# Patient Record
Sex: Female | Born: 1960 | Race: White | Hispanic: No | Smoking: Current every day smoker
Health system: Southern US, Community
[De-identification: ages and names within clinical notes are randomized; demographics above are authoritative.]

## PROBLEM LIST (undated history)

## (undated) ENCOUNTER — Emergency Department (HOSPITAL_BASED_OUTPATIENT_CLINIC_OR_DEPARTMENT_OTHER): Payer: BC Managed Care – PPO | Source: Home / Self Care

## (undated) DIAGNOSIS — R112 Nausea with vomiting, unspecified: Secondary | ICD-10-CM

## (undated) DIAGNOSIS — E669 Obesity, unspecified: Secondary | ICD-10-CM

## (undated) DIAGNOSIS — E7800 Pure hypercholesterolemia, unspecified: Secondary | ICD-10-CM

## (undated) DIAGNOSIS — Z9889 Other specified postprocedural states: Secondary | ICD-10-CM

## (undated) DIAGNOSIS — Z98811 Dental restoration status: Secondary | ICD-10-CM

## (undated) DIAGNOSIS — M1990 Unspecified osteoarthritis, unspecified site: Secondary | ICD-10-CM

## (undated) DIAGNOSIS — L608 Other nail disorders: Secondary | ICD-10-CM

## (undated) DIAGNOSIS — J302 Other seasonal allergic rhinitis: Secondary | ICD-10-CM

## (undated) HISTORY — PX: MENISCUS REPAIR: SHX5179

## (undated) HISTORY — DX: Unspecified osteoarthritis, unspecified site: M19.90

## (undated) HISTORY — DX: Obesity, unspecified: E66.9

## (undated) HISTORY — DX: Other seasonal allergic rhinitis: J30.2

---

## 2001-08-27 HISTORY — PX: ANTERIOR CRUCIATE LIGAMENT REPAIR: SHX115

## 2004-11-25 HISTORY — PX: CHOLECYSTECTOMY: SHX55

## 2010-03-09 ENCOUNTER — Other Ambulatory Visit: Admission: RE | Admit: 2010-03-09 | Discharge: 2010-03-09 | Payer: Self-pay | Admitting: Family Medicine

## 2010-08-09 ENCOUNTER — Emergency Department (HOSPITAL_COMMUNITY): Admission: EM | Admit: 2010-08-09 | Discharge: 2010-08-10 | Payer: Self-pay | Admitting: Emergency Medicine

## 2010-12-08 LAB — URINALYSIS, ROUTINE W REFLEX MICROSCOPIC
Bilirubin Urine: NEGATIVE
Glucose, UA: NEGATIVE mg/dL
Protein, ur: NEGATIVE mg/dL
Urobilinogen, UA: 1 mg/dL (ref 0.0–1.0)

## 2010-12-08 LAB — URINE CULTURE: Colony Count: >=100000

## 2010-12-08 LAB — CBC
Hemoglobin: 14.5 g/dL (ref 12.0–15.0)
MCH: 30 pg (ref 26.0–34.0)
MCHC: 34.4 g/dL (ref 30.0–36.0)
MCV: 87.2 fL (ref 78.0–100.0)
RBC: 4.83 MIL/uL (ref 3.87–5.11)

## 2010-12-08 LAB — DIFFERENTIAL
Basophils Relative: 2 % — ABNORMAL HIGH (ref 0–1)
Eosinophils Absolute: 0.2 10*3/uL (ref 0.0–0.7)
Eosinophils Relative: 1 % (ref 0–5)
Lymphs Abs: 2.5 10*3/uL (ref 0.7–4.0)
Monocytes Absolute: 1.3 10*3/uL — ABNORMAL HIGH (ref 0.1–1.0)
Monocytes Relative: 12 % (ref 3–12)
Neutrophils Relative %: 63 % (ref 43–77)

## 2010-12-08 LAB — BASIC METABOLIC PANEL
CO2: 27 mEq/L (ref 19–32)
Calcium: 9.6 mg/dL (ref 8.4–10.5)
Chloride: 106 mEq/L (ref 96–112)
Glucose, Bld: 117 mg/dL — ABNORMAL HIGH (ref 70–99)
Sodium: 142 mEq/L (ref 135–145)

## 2010-12-08 LAB — URINE MICROSCOPIC-ADD ON

## 2011-02-19 ENCOUNTER — Other Ambulatory Visit: Payer: Self-pay | Admitting: General Surgery

## 2011-02-19 DIAGNOSIS — K469 Unspecified abdominal hernia without obstruction or gangrene: Secondary | ICD-10-CM

## 2011-02-26 ENCOUNTER — Ambulatory Visit
Admission: RE | Admit: 2011-02-26 | Discharge: 2011-02-26 | Disposition: A | Discharge status: Home / Self Care | Payer: BC Managed Care – PPO | Source: Ambulatory Visit | Attending: General Surgery | Admitting: General Surgery

## 2011-02-26 DIAGNOSIS — K469 Unspecified abdominal hernia without obstruction or gangrene: Secondary | ICD-10-CM

## 2011-02-26 MED ORDER — IOHEXOL 300 MG/ML  SOLN
125.0000 mL | Freq: Once | INTRAMUSCULAR | Status: AC | PRN
  Administered 2011-02-26: 125 mL via INTRAVENOUS

## 2011-03-30 ENCOUNTER — Telehealth (INDEPENDENT_AMBULATORY_CARE_PROVIDER_SITE_OTHER): Payer: Self-pay | Admitting: General Surgery

## 2011-03-30 NOTE — Telephone Encounter (Signed)
Patient called to discuss post op instructions. She is scheduled for surgery for a hernia repair. Pt wanted to know if there were any dietary restrictions post op. I informed patient there would be no restrictions on her diet. She also wanted to know if she could swim. I let her know that once she heals from surgery she could resume swimming, but it would be a couple weeks after surgery. She could also do no lifting over 15 lbs for 6 weeks post hernia repair. Pt was satisfied with this information and will call back with any questions.

## 2011-04-06 ENCOUNTER — Ambulatory Visit (HOSPITAL_COMMUNITY)
Admission: RE | Admit: 2011-04-06 | Discharge: 2011-04-06 | Disposition: A | Discharge status: Home / Self Care | Payer: BC Managed Care – PPO | Source: Ambulatory Visit | Attending: General Surgery | Admitting: General Surgery

## 2011-04-06 ENCOUNTER — Other Ambulatory Visit: Payer: Self-pay | Admitting: General Surgery

## 2011-04-06 ENCOUNTER — Telehealth (INDEPENDENT_AMBULATORY_CARE_PROVIDER_SITE_OTHER): Payer: Self-pay | Admitting: General Surgery

## 2011-04-06 ENCOUNTER — Encounter (HOSPITAL_COMMUNITY)
Admission: RE | Admit: 2011-04-06 | Discharge: 2011-04-06 | Disposition: A | Discharge status: Home / Self Care | Payer: BC Managed Care – PPO | Source: Ambulatory Visit | Attending: General Surgery | Admitting: General Surgery

## 2011-04-06 DIAGNOSIS — Z01812 Encounter for preprocedural laboratory examination: Secondary | ICD-10-CM | POA: Insufficient documentation

## 2011-04-06 DIAGNOSIS — Z01811 Encounter for preprocedural respiratory examination: Secondary | ICD-10-CM

## 2011-04-06 DIAGNOSIS — Z01818 Encounter for other preprocedural examination: Secondary | ICD-10-CM | POA: Insufficient documentation

## 2011-04-06 DIAGNOSIS — K439 Ventral hernia without obstruction or gangrene: Secondary | ICD-10-CM

## 2011-04-06 DIAGNOSIS — Z01810 Encounter for preprocedural cardiovascular examination: Secondary | ICD-10-CM | POA: Insufficient documentation

## 2011-04-06 LAB — SURGICAL PCR SCREEN
MRSA, PCR: NEGATIVE
Staphylococcus aureus: POSITIVE — AB

## 2011-04-06 LAB — CBC
MCH: 29.9 pg (ref 26.0–34.0)
MCHC: 34 g/dL (ref 30.0–36.0)
MCV: 87.8 fL (ref 78.0–100.0)
Platelets: 245 10*3/uL (ref 150–400)
RBC: 5.09 MIL/uL (ref 3.87–5.11)
RDW: 13.5 % (ref 11.5–15.5)

## 2011-04-06 LAB — DIFFERENTIAL
Basophils Relative: 1 % (ref 0–1)
Eosinophils Absolute: 0.1 10*3/uL (ref 0.0–0.7)
Eosinophils Relative: 2 % (ref 0–5)
Lymphs Abs: 2.9 10*3/uL (ref 0.7–4.0)
Monocytes Absolute: 0.8 10*3/uL (ref 0.1–1.0)
Monocytes Relative: 8 % (ref 3–12)
Neutrophils Relative %: 59 % (ref 43–77)

## 2011-04-06 LAB — BASIC METABOLIC PANEL
CO2: 30 mEq/L (ref 19–32)
Calcium: 9.8 mg/dL (ref 8.4–10.5)
Creatinine, Ser: 0.67 mg/dL (ref 0.50–1.10)
GFR calc non Af Amer: >60 mL/min (ref >60)
Sodium: 142 mEq/L (ref 135–145)

## 2011-04-06 NOTE — Telephone Encounter (Signed)
Per Dr Andrey Campanile, labs ok for surgery. Labs printed and faxed with cover sheet making pre-admit aware that labs are ok. Faxed to [phone].

## 2011-04-06 NOTE — Telephone Encounter (Signed)
Message copied by Liliana Cline on Tue Apr 06, 2011  3:29 PM ------      Message from: Andrey Campanile, ERIC M      Created: Tue Apr 06, 2011 11:19 AM       Labs ok for surgery

## 2011-04-08 ENCOUNTER — Ambulatory Visit (HOSPITAL_COMMUNITY)
Admission: RE | Admit: 2011-04-08 | Discharge: 2011-04-09 | Disposition: A | Discharge status: Home / Self Care | Payer: BC Managed Care – PPO | Source: Ambulatory Visit | Attending: General Surgery | Admitting: General Surgery

## 2011-04-08 DIAGNOSIS — F17200 Nicotine dependence, unspecified, uncomplicated: Secondary | ICD-10-CM | POA: Insufficient documentation

## 2011-04-08 DIAGNOSIS — E669 Obesity, unspecified: Secondary | ICD-10-CM | POA: Insufficient documentation

## 2011-04-08 DIAGNOSIS — K439 Ventral hernia without obstruction or gangrene: Secondary | ICD-10-CM

## 2011-04-08 DIAGNOSIS — K429 Umbilical hernia without obstruction or gangrene: Secondary | ICD-10-CM | POA: Insufficient documentation

## 2011-04-08 DIAGNOSIS — M129 Arthropathy, unspecified: Secondary | ICD-10-CM | POA: Insufficient documentation

## 2011-04-08 DIAGNOSIS — F411 Generalized anxiety disorder: Secondary | ICD-10-CM | POA: Insufficient documentation

## 2011-04-08 HISTORY — PX: LAPAROSCOPIC INCISIONAL / UMBILICAL / VENTRAL HERNIA REPAIR: SUR789

## 2011-04-08 LAB — POCT I-STAT 4, (NA,K, GLUC, HGB,HCT)
Glucose, Bld: 101 mg/dL — ABNORMAL HIGH (ref 70–99)
HCT: 46 % (ref 36.0–46.0)

## 2011-04-13 ENCOUNTER — Encounter (INDEPENDENT_AMBULATORY_CARE_PROVIDER_SITE_OTHER): Payer: Self-pay | Admitting: General Surgery

## 2011-04-14 NOTE — Op Note (Signed)
Jamie Paul, Jamie Paul                ACCOUNT NO.:  [id number]  MEDICAL RECORD NO.:  [id number]  LOCATION:  5124                         FACILITY:  [facility]  PHYSICIAN:  Mary Sella. Andrey Campanile, MD     DATE OF BIRTH:  12/03/1960  DATE OF PROCEDURE:  04/08/2011 DATE OF DISCHARGE:                              OPERATIVE REPORT   PREOPERATIVE DIAGNOSIS:  Ventral umbilical hernia.  POSTOPERATIVE DIAGNOSIS:  Ventral umbilical hernia.  PROCEDURE:  Laparoscopic ventral umbilical hernia repair with mesh.  SURGEON:  Mary Sella.  Andrey Campanile, MD  ANESTHESIA:  General plus local consisting 0.25% Marcaine with epi.  FINDINGS:  The patient had a 5.5 x 4.5 cm fascial defect around her umbilicus.  When I measured, adding 5 cm in a circumferential manner, they gave me a mesh size of around 13 cm x 12cm. The closest sized mesh size mesh was  a 15 x 20 cm piece of Ventralite ST Bard mesh.  ESTIMATED BLOOD LOSS:  Minimal.  INDICATIONS FOR PROCEDURE:  Patient is a 50 year old morbidly obese Caucasian female who I saw last year initially for her hernia.  At that time, she was obese and smoking and essentially asymptomatic.  I counseled her on weight loss and smoking cessation as well in order to decrease her chance of recurrence or perioperative complications.  She represented to office for new onset of abdominal pain around her umbilicus.  She states that she had been exercising and actually lost 30 pounds, but now she is at the point where the hernia was causing her discomfort when she was doing exercises at the gym.  She still continues to smoke.  We discussed the risks and benefits of surgery including, but not limited to bleeding, infection, injury to surrounding structures, a blood clot formation, wound complications, mesh complications, need to convert to an open procedure and hernia recurrence.  We also talked about the possibility of a prolonged abdominal wall pain due to the hernia repair.  Interestingly,  although she said she had lost 30 pounds she weighed about the same that she did last year, but since she was symptomatic I felt the prudent thing today was to go to proceed to the operating room.  DESCRIPTION OF PROCEDURE:  After obtaining informed consent, the patient was brought to the operating room, placed supine on the operating room table.  General endotracheal anesthesia was established.  A Foley catheter was placed.  Sequential compression devices were placed.  Her abdomen was prepped and draped in the usual standard surgical fashion. She received antibiotics prior to skin incision.  A surgical time-out was performed.  I elected to gain entry to her abdomen using OptiView technique, a 1-cm incision was made in the left upper quadrant, 2 fingerbreadths below her left subcostal margin with #11 blade and using over the 5-mm trocar with a 0-degree 5 mm scope.  I advanced the trocar through all layers of the abdominal wall and smoothly entered her abdominal cavity. Pneumoperitoneum was smoothly established up to a patient pressure of 15 mmHg.  The laparoscope was advanced and the abdominal cavity was surveilled.  She had a plug of omentum into the periumbilical hernia.  I placed  a 11-mm trocar in the left lateral abdominal wall under direct visualization after local had been infiltrated.  Then, using an atraumatic grasper, I reduced the omental plug from the fascial defect. Then, using a spinal needle I marked boundaries of the fascial defect which measured to be about 5.5 x 4 cm.  Then, using a marking pen on the skin I added 5 cm around those marks which gave me a size of mesh to use of around 12 cm x 13 cm.  I placed a size mesh was 15 x 20 cm.  I elected not to trim the mesh.  I placed eight #1 Novafil sutures through the mesh in a circumferential manner around the borders of the mesh, then rolled the mesh like a cigar and placed it through the trocar and rolled it in the  abdominal cavity.  I then made 2-mm skin incisions in a circumferential pattern around the boundaries of the hernia defect out laterally in a circumferential pattern.  Then, using the [employer], I brought up the tails of the sutures that I had been made into the mesh to create a transfascial suture.  This was done 8 times to give a total number of 8 transfascial sutures.  There is a little bit of redundancy in the mesh initially.  Therefore, I released the inferior aspects of the transfascial sutures from the inferior edge of the mesh and then brought in the Endopasser a little bit more inferior through the abdominal wall.  The mesh was then brought up to the abdominal wall. There was a very little redundancy in the mesh at this point.  I tied down all the transfascial sutures and trimmed the excess suture thus all the sutures were underneath the skin.  At this point, I used an Ethicon secure strap tacker to tack the mesh to the abdominal wall in a circumferential manner around the edges of the mesh and then I placed some additional tacks around the actual fascial defect.  The mesh was well seated and there were no signs of bleeding.  Pneumoperitoneum was released and the trocars were removed.  The two trocar sites were closed with 4-0 Monocryl in subcuticular fashion and the remaining skin incisions in the trocar sites were closed with Dermabond.  The Foley catheter was removed.  All needle, instrument counts were correct x2. There were no immediate complications.  The patient tolerated the procedure well.  The patient was transferred to the recovery room.     Mary Sella. Andrey Campanile, MD     EMW/MEDQ  D:  04/08/2011  T:  04/08/2011  Job:  [id number]  Electronically Signed by Gaynelle Adu M.D. on 04/14/2011 11:54:55 PM

## 2011-04-16 ENCOUNTER — Ambulatory Visit (INDEPENDENT_AMBULATORY_CARE_PROVIDER_SITE_OTHER): Payer: BC Managed Care – PPO | Admitting: General Surgery

## 2011-04-16 ENCOUNTER — Encounter (INDEPENDENT_AMBULATORY_CARE_PROVIDER_SITE_OTHER): Payer: Self-pay | Admitting: General Surgery

## 2011-04-16 VITALS — BP 118/78 | Temp 96.6°F

## 2011-04-16 DIAGNOSIS — Z9889 Other specified postprocedural states: Secondary | ICD-10-CM

## 2011-04-16 DIAGNOSIS — Z8719 Personal history of other diseases of the digestive system: Secondary | ICD-10-CM

## 2011-04-16 MED ORDER — NYSTATIN-TRIAMCINOLONE 100000-0.1 UNIT/GM-% EX OINT: TOPICAL_OINTMENT | Freq: Two times a day (BID) | CUTANEOUS | Status: DC

## 2011-04-16 MED ORDER — POLYETHYLENE GLYCOL 3350 17 GM/SCOOP PO POWD: 17.0000 g | ORAL | Status: AC | PRN

## 2011-04-16 NOTE — Progress Notes (Signed)
Procedure: Laparoscopic ventral hernia repair with mesh April 08, 2011  History of Present Ilness: 50 year old obese Caucasian female comes in for one week postoperative check. Since surgery she's been doing fairly well. She states that the abdominal pain has gotten progressively less intense. She denies any nausea, vomiting, fever or chills. She states the main thing has just been a low energy level. She was having daily bowel movements until this past Wednesday. She has not had a bowel movement since Wednesday. She denies any problems with her incisions. She states that she is now able to sleep in the bed. She also has developed a rash in her suprapubic area. It is itchy.  Physical Exam: BP 118/78  Temp(Src) 96.6 F (35.9 C) (Temporal)  Well-developed well-nourished morbidly obese Caucasian female in no apparent distress Pulmonary-lungs are clear Cardiac-regular rate and rhythm Abdomen- obese, soft, nontender, nondistended. Her trocar incisions are well-healed. She has a little bit of puckering in her upper abdomen at one of the transfacial suture sites. Underneath her pannus, she has a fungal rash.   Assessment and Plan: Status post laparoscopic ventral hernia repair with mesh on April 08, 2011. I think overall she's doing very well for one week out. I gave her prescription for MiraLax. I told her to call the office if she should not have a bowel movement last Sunday. I also gave her a prescription for nystatin ointment for her suprapubic rash. I advised her that she should not do any heavy lifting for 6 weeks. I will see her in 6 weeks.

## 2011-04-16 NOTE — Patient Instructions (Signed)
No heavy lifting for 6 weeks. May return to work in 1 week. Call if no bowel movement by Sunday

## 2011-05-20 ENCOUNTER — Ambulatory Visit (INDEPENDENT_AMBULATORY_CARE_PROVIDER_SITE_OTHER): Payer: BC Managed Care – PPO | Admitting: General Surgery

## 2011-05-20 ENCOUNTER — Encounter (INDEPENDENT_AMBULATORY_CARE_PROVIDER_SITE_OTHER): Payer: Self-pay | Admitting: General Surgery

## 2011-05-20 VITALS — BP 126/84 | HR 58

## 2011-05-20 DIAGNOSIS — Z9889 Other specified postprocedural states: Secondary | ICD-10-CM

## 2011-05-20 DIAGNOSIS — Z8719 Personal history of other diseases of the digestive system: Secondary | ICD-10-CM

## 2011-05-20 DIAGNOSIS — Z09 Encounter for follow-up examination after completed treatment for conditions other than malignant neoplasm: Secondary | ICD-10-CM

## 2011-05-20 NOTE — Patient Instructions (Signed)
Can resume full activities next week

## 2011-05-20 NOTE — Progress Notes (Signed)
Chief complaint: Postop  Procedure: Status post laparoscopic ventral hernia repair with mesh on April 08, 2011  History of Present Ilness: 50 year old obese Caucasian female comes in today for her second postoperative appointment. I last saw her on July 20. Since she was last seen she states that she is doing great. She denies any fever, chills, nausea, or vomiting. Her constipation that she had at her last appointment has resolved. She reports daily bowel movements. She reports a normal appetite. She really does not have any ongoing abdominal wall discomfort. She still has the rash in her right inguinal area. She did use the nystatin ointment and it did get better. However there are 2 persistent areas that itch. She has not changed any soaps or laundry detergent  Physical Exam: BP 126/84  Pulse 58  Well-developed well-nourished obese Caucasian female in no apparent distress Pulmonary-lungs are clear Abdomen-soft, nontender, nondistended. Well-healed trocar incisions. No signs of ventral hernia recurrence. No cellulitis. Skin-in her right lower abdominal wall under her pannus there are 2 separate areas of skin changes. Each of these areas are about 3" x 2". The skin in this area is red and almost appears excoriated. There is no fluctuance or induration.  Assessment and Plan: Status post laparoscopic ventral hernia repair with mesh-doing well  I have released her to full activity starting next week. She is already returned to work.  With respect to the skin lesions, I am unsure of the etiology. It does not appear to be fungal. I have advised her to followup with her primary care physician.  I did recommend returning to the gym to resume her exercise as well as to watch her diet to help with weight loss.  I will see her in 6 months

## 2011-06-16 ENCOUNTER — Other Ambulatory Visit (HOSPITAL_COMMUNITY): Payer: Self-pay | Admitting: Family Medicine

## 2011-06-16 DIAGNOSIS — Z1231 Encounter for screening mammogram for malignant neoplasm of breast: Secondary | ICD-10-CM

## 2011-06-24 ENCOUNTER — Ambulatory Visit (HOSPITAL_COMMUNITY)
Admission: RE | Admit: 2011-06-24 | Discharge: 2011-06-24 | Disposition: A | Discharge status: Home / Self Care | Payer: BC Managed Care – PPO | Source: Ambulatory Visit | Attending: Family Medicine | Admitting: Family Medicine

## 2011-06-24 ENCOUNTER — Other Ambulatory Visit: Payer: Self-pay | Admitting: Family Medicine

## 2011-06-24 DIAGNOSIS — Z1231 Encounter for screening mammogram for malignant neoplasm of breast: Secondary | ICD-10-CM

## 2011-06-24 DIAGNOSIS — N63 Unspecified lump in unspecified breast: Secondary | ICD-10-CM

## 2011-07-06 ENCOUNTER — Ambulatory Visit
Admission: RE | Admit: 2011-07-06 | Discharge: 2011-07-06 | Disposition: A | Discharge status: Home / Self Care | Payer: BC Managed Care – PPO | Source: Ambulatory Visit | Attending: Family Medicine | Admitting: Family Medicine

## 2011-07-06 DIAGNOSIS — N63 Unspecified lump in unspecified breast: Secondary | ICD-10-CM

## 2011-10-21 ENCOUNTER — Encounter (INDEPENDENT_AMBULATORY_CARE_PROVIDER_SITE_OTHER): Payer: Self-pay | Admitting: General Surgery

## 2012-07-17 ENCOUNTER — Other Ambulatory Visit: Payer: Self-pay | Admitting: Family Medicine

## 2012-07-17 DIAGNOSIS — Z1231 Encounter for screening mammogram for malignant neoplasm of breast: Secondary | ICD-10-CM

## 2012-07-28 HISTORY — PX: ANKLE SURGERY: SHX546

## 2012-08-23 ENCOUNTER — Ambulatory Visit: Payer: BC Managed Care – PPO

## 2013-02-04 ENCOUNTER — Encounter (HOSPITAL_BASED_OUTPATIENT_CLINIC_OR_DEPARTMENT_OTHER): Payer: Self-pay

## 2013-02-04 ENCOUNTER — Emergency Department (HOSPITAL_BASED_OUTPATIENT_CLINIC_OR_DEPARTMENT_OTHER)
Admission: EM | Admit: 2013-02-04 | Discharge: 2013-02-04 | Disposition: A | Discharge status: Home / Self Care | Payer: BC Managed Care – PPO | Attending: Emergency Medicine | Admitting: Emergency Medicine

## 2013-02-04 DIAGNOSIS — S61209A Unspecified open wound of unspecified finger without damage to nail, initial encounter: Secondary | ICD-10-CM | POA: Insufficient documentation

## 2013-02-04 DIAGNOSIS — Y9389 Activity, other specified: Secondary | ICD-10-CM | POA: Insufficient documentation

## 2013-02-04 DIAGNOSIS — F17200 Nicotine dependence, unspecified, uncomplicated: Secondary | ICD-10-CM | POA: Insufficient documentation

## 2013-02-04 DIAGNOSIS — W260XXA Contact with knife, initial encounter: Secondary | ICD-10-CM | POA: Insufficient documentation

## 2013-02-04 DIAGNOSIS — Z8719 Personal history of other diseases of the digestive system: Secondary | ICD-10-CM | POA: Insufficient documentation

## 2013-02-04 DIAGNOSIS — Z79899 Other long term (current) drug therapy: Secondary | ICD-10-CM | POA: Insufficient documentation

## 2013-02-04 DIAGNOSIS — E785 Hyperlipidemia, unspecified: Secondary | ICD-10-CM | POA: Insufficient documentation

## 2013-02-04 DIAGNOSIS — Z8739 Personal history of other diseases of the musculoskeletal system and connective tissue: Secondary | ICD-10-CM | POA: Insufficient documentation

## 2013-02-04 DIAGNOSIS — S61012A Laceration without foreign body of left thumb without damage to nail, initial encounter: Secondary | ICD-10-CM

## 2013-02-04 DIAGNOSIS — Y929 Unspecified place or not applicable: Secondary | ICD-10-CM | POA: Insufficient documentation

## 2013-02-04 DIAGNOSIS — E669 Obesity, unspecified: Secondary | ICD-10-CM | POA: Insufficient documentation

## 2013-02-04 NOTE — ED Provider Notes (Signed)
History     CSN: [id number]  Arrival date & time 02/04/13  1010   First MD Initiated Contact with Patient 02/04/13 1237      Chief Complaint  Patient presents with  . Laceration    (Consider location/radiation/quality/duration/timing/severity/associated sxs/prior treatment) Patient is a 52 y.o. female presenting with skin laceration. The history is provided by the patient. No language interpreter was used.  Laceration Location:  Finger Finger laceration location:  L thumb Length (cm):  1 Depth:  Cutaneous Quality: straight   Bleeding: controlled   Time since incident:  2 hours Laceration mechanism:  Knife Pain details:    Severity:  Mild Foreign body present:  No foreign bodies Relieved by:  Nothing Pt cut finger ona knife while cutting a bagel  Past Medical History  Diagnosis Date  . Arthritis   . Hyperlipemia   . Obesity   . Seasonal allergies   . Ventral hernia     s/p laparoscopic repair 04/08/2011    Past Surgical History  Procedure Laterality Date  . Gallbladder surgery  11/2004  . Anterior cruciate ligament repair  08/2001    left  . Meniscus repair  5/99; 6/00    left  . Cholecystectomy  11/2004  . Laparoscopic incisional / umbilical / ventral hernia repair  04/08/2011    ventral umbilical hernia; Dr Gaynelle Adu    Family History  Problem Relation Age of Onset  . Heart attack Father   . Arthritis Mother   . Diabetes Mother   . Hyperlipidemia Brother   . Diabetes Brother   . Diabetes Sister     History  Substance Use Topics  . Smoking status: Current Every Day Smoker -- 0.50 packs/day  . Smokeless tobacco: Not on file  . Alcohol Use: Yes     Comment: "rarely"    OB History   Grav Para Term Preterm Abortions TAB SAB Ect Mult Living                  Review of Systems  All other systems reviewed and are negative.    Allergies  Review of patient's allergies indicates no known allergies.  Home Medications   Current Outpatient Rx   Name  Route  Sig  Dispense  Refill  . acetaminophen (TYLENOL ARTHRITIS PAIN) 650 MG CR tablet   Oral   Take 650 mg by mouth every 8 (eight) hours as needed for pain.         Marland Kitchen atorvastatin (LIPITOR) 20 MG tablet   Oral   Take 20 mg by mouth daily.           . Loratadine (CLARITIN PO)   Oral   Take by mouth daily.           . montelukast (SINGULAIR) 10 MG tablet   Oral   Take 10 mg by mouth at bedtime.           . celecoxib (CELEBREX) 200 MG capsule   Oral   Take 200 mg by mouth 2 (two) times daily.           . ergocalciferol (VITAMIN D2) 50000 UNITS capsule   Oral   Take 50,000 Units by mouth once a week.             BP 151/77  Pulse 95  Temp(Src) 99.3 F (37.4 C) (Oral)  Resp 20  Ht 5\' 7"  (1.702 m)  Wt 298 lb (135.172 kg)  BMI 46.66 kg/m2  SpO2 94%  Physical Exam  Nursing note and vitals reviewed. Constitutional: She appears well-developed and well-nourished.  Musculoskeletal:  1cm laceration dorsal aspect of finger  Skin: Skin is warm.  Psychiatric: She has a normal mood and affect.    ED Course  LACERATION REPAIR Date/Time: 02/04/2013 2:18 PM Performed by: Elson Areas Authorized by: Elson Areas Consent: Verbal consent not obtained. Risks and benefits: risks, benefits and alternatives were discussed Required items: required blood products, implants, devices, and special equipment available Body area: upper extremity Laceration length: 1 cm Foreign bodies: no foreign bodies Irrigation solution: saline Debridement: none Skin closure: glue Approximation: loose Patient tolerance: Patient tolerated the procedure well with no immediate complications.   (including critical care time)  Labs Reviewed - No data to display No results found.   1. Laceration of thumb, left, initial encounter    Cut slightly deeper than a paper cut,  Edges lay together perfectly,  Pt offered sutures or dermabond.    MDM  Pt counseled on wound care,   Placed in a splint        Elson Areas, PA-C 02/04/13 1419

## 2013-02-04 NOTE — ED Notes (Signed)
Pt states that she was slicing bagels and sliced her L thumb with a knife.  Pt states that she is concerned because the bleeding took a long time to stop.  Bleeding controlled at present, bandaged well from home.

## 2013-02-04 NOTE — ED Provider Notes (Signed)
Medical screening examination/treatment/procedure(s) were performed by non-physician practitioner and as supervising physician I was immediately available for consultation/collaboration.  Ethelda Chick, MD 02/04/13 [phone]

## 2013-04-08 DIAGNOSIS — Z124 Encounter for screening for malignant neoplasm of cervix: Secondary | ICD-10-CM | POA: Insufficient documentation

## 2013-04-08 DIAGNOSIS — Z1151 Encounter for screening for human papillomavirus (HPV): Secondary | ICD-10-CM | POA: Insufficient documentation

## 2013-05-09 ENCOUNTER — Other Ambulatory Visit: Payer: Self-pay | Admitting: Family Medicine

## 2013-05-09 ENCOUNTER — Other Ambulatory Visit (HOSPITAL_COMMUNITY)
Admission: RE | Admit: 2013-05-09 | Discharge: 2013-05-09 | Disposition: A | Discharge status: Home / Self Care | Payer: BC Managed Care – PPO | Source: Ambulatory Visit | Attending: Family Medicine | Admitting: Family Medicine

## 2014-02-20 ENCOUNTER — Other Ambulatory Visit: Payer: Self-pay

## 2014-02-20 DIAGNOSIS — Z1231 Encounter for screening mammogram for malignant neoplasm of breast: Secondary | ICD-10-CM

## 2014-02-25 ENCOUNTER — Ambulatory Visit
Admission: RE | Admit: 2014-02-25 | Discharge: 2014-02-25 | Disposition: A | Discharge status: Home / Self Care | Payer: BC Managed Care – PPO | Source: Ambulatory Visit

## 2014-02-25 ENCOUNTER — Encounter (INDEPENDENT_AMBULATORY_CARE_PROVIDER_SITE_OTHER): Payer: Self-pay

## 2014-02-25 DIAGNOSIS — Z1231 Encounter for screening mammogram for malignant neoplasm of breast: Secondary | ICD-10-CM

## 2014-02-26 ENCOUNTER — Other Ambulatory Visit: Payer: Self-pay | Admitting: Family Medicine

## 2014-02-26 DIAGNOSIS — N6459 Other signs and symptoms in breast: Secondary | ICD-10-CM

## 2014-03-06 ENCOUNTER — Ambulatory Visit
Admission: RE | Admit: 2014-03-06 | Discharge: 2014-03-06 | Disposition: A | Discharge status: Home / Self Care | Payer: BC Managed Care – PPO | Source: Ambulatory Visit | Attending: Family Medicine | Admitting: Family Medicine

## 2014-03-06 ENCOUNTER — Other Ambulatory Visit: Payer: Self-pay | Admitting: Family Medicine

## 2014-03-06 DIAGNOSIS — N6459 Other signs and symptoms in breast: Secondary | ICD-10-CM

## 2014-03-27 DIAGNOSIS — L608 Other nail disorders: Secondary | ICD-10-CM

## 2014-03-27 HISTORY — DX: Other nail disorders: L60.8

## 2014-04-12 ENCOUNTER — Other Ambulatory Visit: Payer: Self-pay | Admitting: Orthopedic Surgery

## 2014-04-15 ENCOUNTER — Encounter (HOSPITAL_BASED_OUTPATIENT_CLINIC_OR_DEPARTMENT_OTHER): Payer: Self-pay | Admitting: *Deleted

## 2014-04-16 ENCOUNTER — Other Ambulatory Visit: Payer: Self-pay | Admitting: Orthopedic Surgery

## 2014-04-19 ENCOUNTER — Ambulatory Visit (HOSPITAL_BASED_OUTPATIENT_CLINIC_OR_DEPARTMENT_OTHER)
Admission: RE | Admit: 2014-04-19 | Discharge: 2014-04-19 | Disposition: A | Discharge status: Home / Self Care | Payer: BC Managed Care – PPO | Source: Ambulatory Visit | Attending: Orthopedic Surgery | Admitting: Orthopedic Surgery

## 2014-04-19 ENCOUNTER — Encounter (HOSPITAL_BASED_OUTPATIENT_CLINIC_OR_DEPARTMENT_OTHER): Payer: Self-pay | Admitting: Anesthesiology

## 2014-04-19 ENCOUNTER — Ambulatory Visit (HOSPITAL_BASED_OUTPATIENT_CLINIC_OR_DEPARTMENT_OTHER): Payer: BC Managed Care – PPO | Admitting: Anesthesiology

## 2014-04-19 ENCOUNTER — Encounter (HOSPITAL_BASED_OUTPATIENT_CLINIC_OR_DEPARTMENT_OTHER): Payer: BC Managed Care – PPO | Admitting: Anesthesiology

## 2014-04-19 ENCOUNTER — Encounter (HOSPITAL_BASED_OUTPATIENT_CLINIC_OR_DEPARTMENT_OTHER)
Admission: RE | Disposition: A | Discharge status: Home / Self Care | Payer: Self-pay | Source: Ambulatory Visit | Attending: Orthopedic Surgery

## 2014-04-19 DIAGNOSIS — L600 Ingrowing nail: Secondary | ICD-10-CM | POA: Insufficient documentation

## 2014-04-19 DIAGNOSIS — M47812 Spondylosis without myelopathy or radiculopathy, cervical region: Secondary | ICD-10-CM | POA: Insufficient documentation

## 2014-04-19 DIAGNOSIS — M1710 Unilateral primary osteoarthritis, unspecified knee: Secondary | ICD-10-CM | POA: Diagnosis not present

## 2014-04-19 DIAGNOSIS — IMO0002 Reserved for concepts with insufficient information to code with codable children: Secondary | ICD-10-CM | POA: Insufficient documentation

## 2014-04-19 DIAGNOSIS — M19079 Primary osteoarthritis, unspecified ankle and foot: Secondary | ICD-10-CM | POA: Diagnosis not present

## 2014-04-19 DIAGNOSIS — Z6841 Body Mass Index (BMI) 40.0 and over, adult: Secondary | ICD-10-CM | POA: Diagnosis not present

## 2014-04-19 DIAGNOSIS — M19049 Primary osteoarthritis, unspecified hand: Secondary | ICD-10-CM | POA: Insufficient documentation

## 2014-04-19 DIAGNOSIS — F17200 Nicotine dependence, unspecified, uncomplicated: Secondary | ICD-10-CM | POA: Insufficient documentation

## 2014-04-19 HISTORY — DX: Other nail disorders: L60.8

## 2014-04-19 HISTORY — DX: Other specified postprocedural states: R11.2

## 2014-04-19 HISTORY — PX: NAILBED REPAIR: SHX5028

## 2014-04-19 HISTORY — DX: Nausea with vomiting, unspecified: Z98.890

## 2014-04-19 HISTORY — DX: Dental restoration status: Z98.811

## 2014-04-19 HISTORY — DX: Pure hypercholesterolemia, unspecified: E78.00

## 2014-04-19 LAB — POCT HEMOGLOBIN-HEMACUE: Hemoglobin: 15.3 g/dL — ABNORMAL HIGH (ref 12.0–15.0)

## 2014-04-19 SURGERY — REPAIR, NAIL BED
Anesthesia: General | Site: Thumb | Laterality: Left

## 2014-04-19 MED ORDER — CHLORHEXIDINE GLUCONATE 4 % EX LIQD: 60.0000 mL | Freq: Once | CUTANEOUS | Status: DC

## 2014-04-19 MED ORDER — CEFAZOLIN SODIUM 1-5 GM-% IV SOLN
INTRAVENOUS | Status: AC
  Filled 2014-04-19: qty 50

## 2014-04-19 MED ORDER — OXYCODONE HCL 5 MG PO TABS: 5.0000 mg | ORAL_TABLET | Freq: Once | ORAL | Status: DC | PRN

## 2014-04-19 MED ORDER — 0.9 % SODIUM CHLORIDE (POUR BTL) OPTIME
TOPICAL | Status: DC | PRN
  Administered 2014-04-19: 300 mL

## 2014-04-19 MED ORDER — DEXAMETHASONE SODIUM PHOSPHATE 4 MG/ML IJ SOLN
INTRAMUSCULAR | Status: DC | PRN
  Administered 2014-04-19: 10 mg via INTRAVENOUS

## 2014-04-19 MED ORDER — FENTANYL CITRATE 0.05 MG/ML IJ SOLN
INTRAMUSCULAR | Status: AC
  Filled 2014-04-19: qty 4

## 2014-04-19 MED ORDER — CEFAZOLIN SODIUM-DEXTROSE 2-3 GM-% IV SOLR
INTRAVENOUS | Status: AC
  Filled 2014-04-19: qty 50

## 2014-04-19 MED ORDER — HYDROMORPHONE HCL PF 1 MG/ML IJ SOLN
0.2500 mg | INTRAMUSCULAR | Status: DC | PRN
  Administered 2014-04-19 (×2): 0.5 mg via INTRAVENOUS

## 2014-04-19 MED ORDER — OXYCODONE HCL 5 MG/5ML PO SOLN: 5.0000 mg | Freq: Once | ORAL | Status: DC | PRN

## 2014-04-19 MED ORDER — BUPIVACAINE HCL (PF) 0.25 % IJ SOLN
INTRAMUSCULAR | Status: AC
  Filled 2014-04-19: qty 30

## 2014-04-19 MED ORDER — MIDAZOLAM HCL 2 MG/2ML IJ SOLN
INTRAMUSCULAR | Status: AC
  Filled 2014-04-19: qty 2

## 2014-04-19 MED ORDER — FENTANYL CITRATE 0.05 MG/ML IJ SOLN
50.0000 ug | INTRAMUSCULAR | Status: DC | PRN
  Administered 2014-04-19: 100 ug via INTRAVENOUS

## 2014-04-19 MED ORDER — PROMETHAZINE HCL 25 MG/ML IJ SOLN
INTRAMUSCULAR | Status: AC
  Filled 2014-04-19: qty 1

## 2014-04-19 MED ORDER — ONDANSETRON HCL 4 MG/2ML IJ SOLN
INTRAMUSCULAR | Status: DC | PRN
  Administered 2014-04-19: 4 mg via INTRAVENOUS

## 2014-04-19 MED ORDER — MIDAZOLAM HCL 2 MG/ML PO SYRP: 12.0000 mg | ORAL_SOLUTION | Freq: Once | ORAL | Status: DC | PRN

## 2014-04-19 MED ORDER — ROPIVACAINE HCL 5 MG/ML IJ SOLN
INTRAMUSCULAR | Status: DC | PRN
  Administered 2014-04-19: 30 mL via PERINEURAL

## 2014-04-19 MED ORDER — MIDAZOLAM HCL 2 MG/2ML IJ SOLN
1.0000 mg | INTRAMUSCULAR | Status: DC | PRN
  Administered 2014-04-19: 2 mg via INTRAVENOUS

## 2014-04-19 MED ORDER — DEXTROSE 5 % IV SOLN: 3.0000 g | INTRAVENOUS | Status: DC

## 2014-04-19 MED ORDER — FENTANYL CITRATE 0.05 MG/ML IJ SOLN
INTRAMUSCULAR | Status: AC
  Filled 2014-04-19: qty 2

## 2014-04-19 MED ORDER — MIDAZOLAM HCL 2 MG/2ML IJ SOLN
INTRAMUSCULAR | Status: DC | PRN
Start: 2014-04-19 — End: 2014-04-22
  Administered 2014-04-19: 1 mg via INTRAVENOUS

## 2014-04-19 MED ORDER — FENTANYL CITRATE 0.05 MG/ML IJ SOLN
INTRAMUSCULAR | Status: DC | PRN
  Administered 2014-04-19 (×2): 25 ug via INTRAVENOUS

## 2014-04-19 MED ORDER — PROPOFOL 10 MG/ML IV BOLUS
INTRAVENOUS | Status: DC | PRN
  Administered 2014-04-19: 200 mg via INTRAVENOUS

## 2014-04-19 MED ORDER — LIDOCAINE HCL (CARDIAC) 20 MG/ML IV SOLN
INTRAVENOUS | Status: DC | PRN
  Administered 2014-04-19: 50 mg via INTRAVENOUS

## 2014-04-19 MED ORDER — DEXTROSE 5 % IV SOLN
3.0000 g | INTRAVENOUS | Status: AC
  Administered 2014-04-19: 3 g via INTRAVENOUS

## 2014-04-19 MED ORDER — HYDROMORPHONE HCL PF 1 MG/ML IJ SOLN
INTRAMUSCULAR | Status: AC
  Filled 2014-04-19: qty 1

## 2014-04-19 MED ORDER — HYDROCODONE-ACETAMINOPHEN 10-325 MG PO TABS: 1.0000 | ORAL_TABLET | Freq: Four times a day (QID) | ORAL | Status: DC | PRN

## 2014-04-19 MED ORDER — PROMETHAZINE HCL 25 MG/ML IJ SOLN
6.2500 mg | INTRAMUSCULAR | Status: DC | PRN
  Administered 2014-04-19: 12.5 mg via INTRAVENOUS

## 2014-04-19 MED ORDER — LACTATED RINGERS IV SOLN
INTRAVENOUS | Status: DC
  Administered 2014-04-19: 14:00:00 via INTRAVENOUS

## 2014-04-19 SURGICAL SUPPLY — 43 items
BANDAGE COBAN STERILE 2 (GAUZE/BANDAGES/DRESSINGS) ×3 IMPLANT
BLADE MINI RND TIP GREEN BEAV (BLADE) ×3 IMPLANT
BLADE SURG 15 STRL LF DISP TIS (BLADE) ×1 IMPLANT
BLADE SURG 15 STRL SS (BLADE) ×2
BNDG COHESIVE 1X5 TAN STRL LF (GAUZE/BANDAGES/DRESSINGS) IMPLANT
BNDG ESMARK 4X9 LF (GAUZE/BANDAGES/DRESSINGS) ×3 IMPLANT
CHLORAPREP W/TINT 26ML (MISCELLANEOUS) ×3 IMPLANT
CORDS BIPOLAR (ELECTRODE) ×3 IMPLANT
COVER MAYO STAND STRL (DRAPES) ×3 IMPLANT
COVER TABLE BACK 60X90 (DRAPES) ×3 IMPLANT
CUFF TOURNIQUET SINGLE 18IN (TOURNIQUET CUFF) IMPLANT
CUFF TOURNIQUET SINGLE 24IN (TOURNIQUET CUFF) ×3 IMPLANT
DRAPE EXTREMITY T 121X128X90 (DRAPE) ×3 IMPLANT
DRAPE SURG 17X23 STRL (DRAPES) ×3 IMPLANT
GAUZE SPONGE 4X4 12PLY STRL (GAUZE/BANDAGES/DRESSINGS) ×3 IMPLANT
GAUZE XEROFORM 1X8 LF (GAUZE/BANDAGES/DRESSINGS) ×3 IMPLANT
GLOVE BIO SURGEON STRL SZ7.5 (GLOVE) ×3 IMPLANT
GLOVE BIOGEL PI IND STRL 8 (GLOVE) ×1 IMPLANT
GLOVE BIOGEL PI IND STRL 8.5 (GLOVE) ×1 IMPLANT
GLOVE BIOGEL PI INDICATOR 8 (GLOVE) ×2
GLOVE BIOGEL PI INDICATOR 8.5 (GLOVE) ×2
GLOVE SURG ORTHO 8.0 STRL STRW (GLOVE) ×3 IMPLANT
GLOVE SURG SYN 8.0 (GLOVE) ×3 IMPLANT
GOWN STRL REUS W/ TWL LRG LVL3 (GOWN DISPOSABLE) ×1 IMPLANT
GOWN STRL REUS W/TWL LRG LVL3 (GOWN DISPOSABLE) ×2
GOWN STRL REUS W/TWL XL LVL3 (GOWN DISPOSABLE) ×3 IMPLANT
NEEDLE 27GAX1X1/2 (NEEDLE) IMPLANT
NS IRRIG 1000ML POUR BTL (IV SOLUTION) ×3 IMPLANT
PACK BASIN DAY SURGERY FS (CUSTOM PROCEDURE TRAY) ×3 IMPLANT
PADDING CAST ABS 4INX4YD NS (CAST SUPPLIES) ×2
PADDING CAST ABS COTTON 4X4 ST (CAST SUPPLIES) ×1 IMPLANT
SPLINT FINGER 5/8X3.25 (SOFTGOODS) ×1 IMPLANT
SPLINT FINGER FOAM 3 9119 05 (SOFTGOODS) ×3
STOCKINETTE 4X48 STRL (DRAPES) ×3 IMPLANT
SUT CHROMIC 6 0 G 1 (SUTURE) ×3 IMPLANT
SUT STEEL 4 0 (SUTURE) ×3 IMPLANT
SUT VIC AB 4-0 P2 18 (SUTURE) ×3 IMPLANT
SUT VICRYL RAPID 5 0 P 3 (SUTURE) IMPLANT
SUT VICRYL RAPIDE 4/0 PS 2 (SUTURE) ×3 IMPLANT
SYR BULB 3OZ (MISCELLANEOUS) ×3 IMPLANT
SYRINGE CONTROL L 12CC (SYRINGE) IMPLANT
TOWEL OR 17X24 6PK STRL BLUE (TOWEL DISPOSABLE) ×6 IMPLANT
UNDERPAD 30X30 INCONTINENT (UNDERPADS AND DIAPERS) ×3 IMPLANT

## 2014-04-19 NOTE — Anesthesia Postprocedure Evaluation (Signed)
  Anesthesia Post-op Note  Patient: Jamie Paul  Procedure(s) Performed: Procedure(s): RECONSTRUCTION LEFT THUMB NAILBED WITH PALMARIS LONGUS GRAFT (Left)  Patient Location: PACU  Anesthesia Type:GA combined with regional for post-op pain  Level of Consciousness: awake and alert   Airway and Oxygen Therapy: Patient Spontanous Breathing  Post-op Pain: mild  Post-op Assessment: Post-op Vital signs reviewed  Post-op Vital Signs: stable  Last Vitals:  Filed Vitals:   04/19/14 1730  BP: 109/67  Pulse: 83  Temp:   Resp: 11    Complications: No apparent anesthesia complications

## 2014-04-19 NOTE — Anesthesia Procedure Notes (Addendum)
Anesthesia Regional Block:  Supraclavicular block  Pre-Anesthetic Checklist: ,, timeout performed, Correct Patient, Correct Site, Correct Laterality, Correct Procedure, Correct Position, site marked, Risks and benefits discussed,  Surgical consent,  Pre-op evaluation,  At surgeon's request and post-op pain management  Laterality: Left and Upper  Prep: chloraprep       Needles:   Needle Type: Echogenic Needle     Needle Length: 9cm 9 cm Needle Gauge: 21 and 21 G  Needle insertion depth: 5 cm   Additional Needles:  Procedures: ultrasound guided (picture in chart) and nerve stimulator Supraclavicular block Narrative:  Start time: 04/19/2014 2:35 PM End time: 04/19/2014 2:55 PM Injection made incrementally with aspirations every 5 mL.  Performed by: Personally  Anesthesiologist: T Massagee  Additional Notes: Tolerated well   Procedure Name: LMA Insertion Performed by: Lance CoonWEBSTER, Nice Pre-anesthesia Checklist: Patient identified, Timeout performed, Emergency Drugs available, Suction available and Patient being monitored Patient Re-evaluated:Patient Re-evaluated prior to inductionOxygen Delivery Method: Circle system utilized Preoxygenation: Pre-oxygenation with 100% oxygen Intubation Type: IV induction Ventilation: Mask ventilation without difficulty LMA: LMA with gastric port inserted LMA Size: 4.0 Number of attempts: 1 Placement Confirmation: breath sounds checked- equal and bilateral and positive ETCO2 Tube secured with: Tape Dental Injury: Teeth and Oropharynx as per pre-operative assessment

## 2014-04-19 NOTE — Progress Notes (Signed)
Assisted Dr. Massagee with left, ultrasound guided, supraclavicular block. Side rails up, monitors on throughout procedure. See vital signs in flow sheet. Tolerated Procedure well. 

## 2014-04-19 NOTE — Transfer of Care (Signed)
Immediate Anesthesia Transfer of Care Note  Patient: Jamie Paul  Procedure(s) Performed: Procedure(s): RECONSTRUCTION LEFT THUMB NAILBED WITH PALMARIS LONGUS GRAFT (Left)  Patient Location: PACU  Anesthesia Type:General  Level of Consciousness: awake, alert  and oriented  Airway & Oxygen Therapy: Patient Spontanous Breathing and Patient connected to face mask oxygen  Post-op Assessment: Report given to PACU RN and Post -op Vital signs reviewed and stable  Post vital signs: Reviewed and stable  Complications: No apparent anesthesia complications

## 2014-04-19 NOTE — Brief Op Note (Signed)
04/19/2014  4:20 PM  PATIENT:  Jamie Paul  53 y.o. female  PRE-OPERATIVE DIAGNOSIS:  LEFT THUMB PAINFUL NAIL DEFORMITY  POST-OPERATIVE DIAGNOSIS:  LEFT THUMB PAINFUL NAIL DEFORMITY  PROCEDURE:  Procedure(s): RECONSTRUCTION LEFT THUMB NAILBED WITH PALMARIS LONGUS GRAFT (Left)  SURGEON:  Surgeon(s) and Role:    * Nicki ReaperGary R Dontee Jaso, MD - Primary    * Tami RibasKevin R Nastassia Bazaldua, MD - Assisting  PHYSICIAN ASSISTANT:   ASSISTANTS: K Aldean Suddeth,MD   ANESTHESIA:   regional and IV sedation  EBL:  Total I/O In: 1300 [I.V.:1300] Out: -   BLOOD ADMINISTERED:none  DRAINS: none   LOCAL MEDICATIONS USED:  NONE  SPECIMEN:  No Specimen  DISPOSITION OF SPECIMEN:  N/A  COUNTS:  YES  TOURNIQUET:   Total Tourniquet Time Documented: Upper Arm (Left) - 51 minutes Total: Upper Arm (Left) - 51 minutes   DICTATION: .Other Dictation: Dictation Number [id number]  PLAN OF CARE: Discharge to home after PACU  PATIENT DISPOSITION:  PACU - hemodynamically stable.

## 2014-04-19 NOTE — H&P (Signed)
Jamie Paul is a 53 year-old right-hand dominant female with trumpet pincers nails on her thumbs bilaterally with a mass in the volar aspect of the metacarpophalangeal joint right side.  These have progressed over a number of years. She complains of severe pain on her left side where there is a significant deformity present.  She relates no history of injury.    PAST MEDICAL HISTORY:  She has no history of diabetes, thyroid problems, arthritis or gout.   MEDICATIONS:  Lipitor, Celebrex, Singulair, Claritin and vitamin D.   SURGICAL HISTORY:  She has had left knee surgery (meniscus x 2, ACL), right ankle ligamentous reconstruction.  FAMILY MEDICAL HISTORY:  Positive for diabetes, heart disease, high blood pressure and arthritis.  SOCIAL HISTORY:  She smokes half pack a day and is advised to quit with the reasons behind this.  She drinks socially.  She is single and works in Photographer.   REVIEW OF SYSTEMS:   Positive for glasses, otherwise negative 14 points.  Jamie Paul is an 53 y.o. female.   Chief Complaint: Trumpet nail left thumb HPI: see above  Past Medical History  Diagnosis Date  . Obesity   . Seasonal allergies   . Arthritis     knees, ankles, neck, hands  . High cholesterol   . Nail deformity 03/2014    left thumb  . Dental crowns present     x 2 - right side  . PONV (postoperative nausea and vomiting)     Past Surgical History  Procedure Laterality Date  . Anterior cruciate ligament repair  08/2001    left  . Meniscus repair Left 01/1998; 02/1999  . Ankle surgery Right 07/2012    repair of tendons and ligaments  . Cholecystectomy  11/2004  . Laparoscopic incisional / umbilical / ventral hernia repair  04/08/2011    ventral umbilical hernia repair    Family History  Problem Relation Age of Onset  . Heart attack Father   . Arthritis Mother   . Diabetes Mother   . Hyperlipidemia Brother   . Diabetes Brother   . Diabetes Sister    Social History:  reports that  she has been smoking Cigarettes.  She has a 15 pack-year smoking history. She has never used smokeless tobacco. She reports that she drinks alcohol. She reports that she does not use illicit drugs.  Allergies: No Known Allergies  Medications Prior to Admission  Medication Sig Dispense Refill  . atorvastatin (LIPITOR) 20 MG tablet Take 20 mg by mouth daily.        . celecoxib (CELEBREX) 200 MG capsule Take 200 mg by mouth 2 (two) times daily.        . ergocalciferol (VITAMIN D2) 50000 UNITS capsule Take 50,000 Units by mouth once a week.        . Loratadine (CLARITIN PO) Take by mouth daily.        . montelukast (SINGULAIR) 10 MG tablet Take 10 mg by mouth at bedtime.        . nicotine (NICODERM CQ - DOSED IN MG/24 HOURS) 14 mg/24hr patch Place 14 mg onto the skin daily.        Results for orders placed during the hospital encounter of 04/19/14 (from the past 48 hour(s))  POCT HEMOGLOBIN-HEMACUE     Status: Abnormal   Collection Time    04/19/14  2:08 PM      Result Value Ref Range   Hemoglobin 15.3 (*) 12.0 - 15.0 g/dL  No results found.   Pertinent items are noted in HPI.  Blood pressure 107/60, pulse 85, temperature 98.8 F (37.1 C), temperature source Oral, resp. rate 20, height 5\' 7"  (1.702 m), weight 140.615 kg (310 lb), SpO2 96.00%.  General appearance: alert, cooperative and appears stated age Head: Normocephalic, without obvious abnormality Neck: no JVD Resp: clear to auscultation bilaterally Cardio: regular rate and rhythm, S1, S2 normal, no murmur, click, rub or gallop GI: soft, non-tender; bowel sounds normal; no masses,  no organomegaly Extremities: pincer nail bilateral thumbs Pulses: 2+ and symmetric Skin: Skin color, texture, turgor normal. No rashes or lesions Neurologic: Grossly normal Incision/Wound: na  Assessment/Plan We have discussed the reconstruction of the nail ligament support both radially and ulnarly, using either dermal or small portions of  tendon to do this.  It is unlikely that we are going to be able to fully develop a complete nail distally.  The alternative would be a graft.  She would like to maintain the nail if possible.  She is aware of risks and complications including infection, recurrence, incomplete relief of symptoms, possibility of future surgery including ablation of the nail with grafting if this does not satisfy her.    She is scheduled for Dermagraft nail bed reconstruction left thumb as an outpatient.    Trenda Corliss R 04/19/2014, 2:44 PM

## 2014-04-19 NOTE — Anesthesia Preprocedure Evaluation (Signed)
Anesthesia Evaluation  Patient identified by MRN, date of birth, ID band Patient awake    Reviewed: Allergy & Precautions, H&P , NPO status , Patient's Chart, lab work & pertinent test results  History of Anesthesia Complications (+) PONV  Airway       Dental   Pulmonary Current Smoker,          Cardiovascular negative cardio ROS      Neuro/Psych    GI/Hepatic   Endo/Other  Morbid obesity  Renal/GU      Musculoskeletal   Abdominal (+) + obese,   Peds  Hematology   Anesthesia Other Findings   Reproductive/Obstetrics                           Anesthesia Physical Anesthesia Plan  ASA: III  Anesthesia Plan: General   Post-op Pain Management:    Induction: Intravenous  Airway Management Planned:   Additional Equipment:   Intra-op Plan:   Post-operative Plan:   Informed Consent:   Plan Discussed with:   Anesthesia Plan Comments:         Anesthesia Quick Evaluation

## 2014-04-19 NOTE — Discharge Instructions (Addendum)

## 2014-04-19 NOTE — Op Note (Signed)
NAMLuna Glasgow:  Elzina, REENA                ACCOUNT NO.:  [id number]  MEDICAL RECORD NO.:  [id number]  LOCATION:                                 FACILITY:  PHYSICIAN:  Cindee SaltGary Kimbella Heisler, M.D.            DATE OF BIRTH:  DATE OF PROCEDURE:  04/19/2014 DATE OF DISCHARGE:                              OPERATIVE REPORT   PREOPERATIVE DIAGNOSIS:  Trumpet nail, left thumb.  POSTOPERATIVE DIAGNOSIS:  Trumpet nail, left thumb.  OPERATION:  [place] reconstruction of left thumb nail with collagen grafts to the lateral nail ligaments, left thumb using palmaris longus.  SURGEON:  Cindee SaltGary Calab Sachse, M.D.  ASSISTANT:  Betha LoaKevin Dalaya Suppa, MD  ANESTHESIA:  Supraclavicular block with sedation.  ANESTHESIOLOGIST:  Burna FortsJames T. Massagee, M.D.  HISTORY:  The patient is a 53 year old female with a history of a trumpet nail with the nail nearly touching distally.  Thumb is painful, desires reconstruction.  She also has it on her right thumb not to the extent of her left.  She is aware of risks and complications including infection; recurrence of injury to arteries, nerves, tendons; incomplete release of symptoms; dystrophy, possibility of deformity to the nail as it regrows that it may recur that a graft may be necessary with nail bed ablation.  In the preoperative area, the patient is seen, the extremity marked by both patient and surgeon.  Antibiotic given.  PROCEDURE IN DETAIL:  The patient was brought to the operating room, where a supraclavicular block was carried out in the preoperative area by Dr. Jacklynn BueMassagee.  She was given sedation, prepped using ChloraPrep, supine position with the left arm free.  A 3-minute dry time was allowed.  Time-out taken, confirming the patient and procedure.  The limb was exsanguinated with an Esmarch bandage.  Tourniquet placed high on the arm was inflated to 250 mmHg.  The nail plate was carefully removed with an elevator.  The lateral nail folds were then elevated and incision made distally,  carried under the entire nail matrix to the proximal aspect until the nail was flattened, this allowed the entire nail matrix to be elevated off including the lateral folds, this was able to be flattened out.  At that point in time, a rongeur was then used to smooth the dorsal aspect of the distal phalanx and bleeders electrocauterized and irrigation performed.  A strip of palmaris longus tendon was then harvested through a longitudinal incision, approximately 4 cm was taken.  This was approximately 25% of the tendon itself, remainder left intact.  This was detached, folded over and cut and passed.  A curved tendon braider was then used to create a tunnel beneath the nail matrix proximally and out to the dorsal skin.  This allowed a monofilament wire to be doubled over and used as a tendon passer passing each half of the tendon, one radially, one ulnarly and sutured to the skin proximally.  The distal portion was then sutured to the tip of the pulp distally, this was done with 4-0 Vicryl sutures. This stabilized the tendon grafts both radially and ulnarly.  The nail matrix was then passed down distally, the distal portion was  then brought in, the nail plate was then sutured to the skin along the entire course using horizontal mattress sutures, this entirely flattened the entire distal nail matrix.  A piece of aluminum from the suture pack was then shaped into the shape of the nail plate.  This was then placed into the nail fold proximally and out distally.  A sterile compressive dressing and dorsal splint applied.  On deflation of the tourniquet, remaining fingers pinked.  She was taken to the recovery room for observation in a satisfactory condition.  The proximal wound over the palmaris longus graft site was closed with interrupted 4-0 Vicryl Rapide sutures.  The patient tolerated the procedure well.  She will be discharged home to return to the [facility] in 1 week  on Vicodin.          ______________________________ Cindee Salt, M.D.     GK/MEDQ  D:  04/19/2014  T:  04/19/2014  Job:  [id number]

## 2014-04-19 NOTE — Op Note (Signed)
Dictation Number [id number]

## 2014-04-22 ENCOUNTER — Encounter (HOSPITAL_BASED_OUTPATIENT_CLINIC_OR_DEPARTMENT_OTHER): Payer: Self-pay | Admitting: Orthopedic Surgery

## 2015-11-19 ENCOUNTER — Other Ambulatory Visit: Payer: Self-pay | Admitting: Orthopedic Surgery

## 2015-12-18 ENCOUNTER — Encounter (HOSPITAL_BASED_OUTPATIENT_CLINIC_OR_DEPARTMENT_OTHER): Payer: Self-pay | Admitting: *Deleted

## 2015-12-23 ENCOUNTER — Encounter (HOSPITAL_BASED_OUTPATIENT_CLINIC_OR_DEPARTMENT_OTHER)
Admission: RE | Admit: 2015-12-23 | Discharge: 2015-12-23 | Disposition: A | Discharge status: Home / Self Care | Payer: BLUE CROSS/BLUE SHIELD | Source: Ambulatory Visit | Attending: Orthopedic Surgery | Admitting: Orthopedic Surgery

## 2015-12-23 DIAGNOSIS — L608 Other nail disorders: Secondary | ICD-10-CM | POA: Diagnosis not present

## 2015-12-23 DIAGNOSIS — F17210 Nicotine dependence, cigarettes, uncomplicated: Secondary | ICD-10-CM | POA: Diagnosis not present

## 2015-12-23 DIAGNOSIS — M65841 Other synovitis and tenosynovitis, right hand: Secondary | ICD-10-CM | POA: Diagnosis present

## 2015-12-23 NOTE — Progress Notes (Signed)
Anesthesia consult done by DR Delrae AlfredM Judd , clear for surgery at center

## 2015-12-25 ENCOUNTER — Ambulatory Visit (HOSPITAL_BASED_OUTPATIENT_CLINIC_OR_DEPARTMENT_OTHER)
Admission: RE | Admit: 2015-12-25 | Discharge: 2015-12-25 | Disposition: A | Discharge status: Home / Self Care | Payer: BLUE CROSS/BLUE SHIELD | Source: Ambulatory Visit | Attending: Orthopedic Surgery | Admitting: Orthopedic Surgery

## 2015-12-25 ENCOUNTER — Ambulatory Visit (HOSPITAL_BASED_OUTPATIENT_CLINIC_OR_DEPARTMENT_OTHER): Payer: BLUE CROSS/BLUE SHIELD | Admitting: Anesthesiology

## 2015-12-25 ENCOUNTER — Encounter (HOSPITAL_BASED_OUTPATIENT_CLINIC_OR_DEPARTMENT_OTHER)
Admission: RE | Disposition: A | Discharge status: Home / Self Care | Payer: Self-pay | Source: Ambulatory Visit | Attending: Orthopedic Surgery

## 2015-12-25 ENCOUNTER — Encounter (HOSPITAL_BASED_OUTPATIENT_CLINIC_OR_DEPARTMENT_OTHER): Payer: Self-pay | Admitting: Orthopedic Surgery

## 2015-12-25 DIAGNOSIS — F17210 Nicotine dependence, cigarettes, uncomplicated: Secondary | ICD-10-CM | POA: Insufficient documentation

## 2015-12-25 DIAGNOSIS — L608 Other nail disorders: Secondary | ICD-10-CM | POA: Insufficient documentation

## 2015-12-25 DIAGNOSIS — M65841 Other synovitis and tenosynovitis, right hand: Secondary | ICD-10-CM | POA: Insufficient documentation

## 2015-12-25 HISTORY — PX: TRIGGER FINGER RELEASE: SHX641

## 2015-12-25 HISTORY — PX: NAILBED REPAIR: SHX5028

## 2015-12-25 HISTORY — PX: MASS EXCISION: SHX2000

## 2015-12-25 SURGERY — REPAIR, NAIL BED
Anesthesia: Regional | Site: Thumb | Laterality: Right

## 2015-12-25 MED ORDER — DEXAMETHASONE SODIUM PHOSPHATE 10 MG/ML IJ SOLN
INTRAMUSCULAR | Status: AC
  Filled 2015-12-25: qty 1

## 2015-12-25 MED ORDER — FENTANYL CITRATE (PF) 100 MCG/2ML IJ SOLN
INTRAMUSCULAR | Status: AC
  Filled 2015-12-25: qty 2

## 2015-12-25 MED ORDER — BUPIVACAINE-EPINEPHRINE (PF) 0.5% -1:200000 IJ SOLN
INTRAMUSCULAR | Status: DC | PRN
  Administered 2015-12-25: 25 mL via PERINEURAL

## 2015-12-25 MED ORDER — LACTATED RINGERS IV SOLN
INTRAVENOUS | Status: DC
  Administered 2015-12-25: 10 mL/h via INTRAVENOUS

## 2015-12-25 MED ORDER — CEFAZOLIN SODIUM-DEXTROSE 2-4 GM/100ML-% IV SOLN
INTRAVENOUS | Status: AC
  Filled 2015-12-25: qty 100

## 2015-12-25 MED ORDER — ACETAMINOPHEN 325 MG PO TABS
ORAL_TABLET | ORAL | Status: AC
  Filled 2015-12-25: qty 2

## 2015-12-25 MED ORDER — GLYCOPYRROLATE 0.2 MG/ML IJ SOLN: 0.2000 mg | Freq: Once | INTRAMUSCULAR | Status: DC | PRN

## 2015-12-25 MED ORDER — PROPOFOL 10 MG/ML IV BOLUS
INTRAVENOUS | Status: DC | PRN
  Administered 2015-12-25: 200 mg via INTRAVENOUS
  Administered 2015-12-25: 50 mg via INTRAVENOUS

## 2015-12-25 MED ORDER — ACETAMINOPHEN 325 MG PO TABS
650.0000 mg | ORAL_TABLET | Freq: Once | ORAL | Status: AC
  Administered 2015-12-25: 650 mg via ORAL

## 2015-12-25 MED ORDER — LIDOCAINE HCL (CARDIAC) 20 MG/ML IV SOLN
INTRAVENOUS | Status: AC
  Filled 2015-12-25: qty 5

## 2015-12-25 MED ORDER — LIDOCAINE HCL (CARDIAC) 20 MG/ML IV SOLN
INTRAVENOUS | Status: AC
Start: 2015-12-25 — End: 2015-12-25
  Filled 2015-12-25: qty 5

## 2015-12-25 MED ORDER — SCOPOLAMINE 1 MG/3DAYS TD PT72
MEDICATED_PATCH | TRANSDERMAL | Status: AC
  Filled 2015-12-25: qty 1

## 2015-12-25 MED ORDER — HYDROCODONE-ACETAMINOPHEN 7.5-325 MG PO TABS: 1.0000 | ORAL_TABLET | Freq: Once | ORAL | Status: DC | PRN

## 2015-12-25 MED ORDER — MIDAZOLAM HCL 2 MG/2ML IJ SOLN
1.0000 mg | INTRAMUSCULAR | Status: DC | PRN
  Administered 2015-12-25: 1 mg via INTRAVENOUS

## 2015-12-25 MED ORDER — KETOROLAC TROMETHAMINE 30 MG/ML IJ SOLN: 30.0000 mg | Freq: Once | INTRAMUSCULAR | Status: DC

## 2015-12-25 MED ORDER — MIDAZOLAM HCL 2 MG/2ML IJ SOLN
INTRAMUSCULAR | Status: AC
  Filled 2015-12-25: qty 2

## 2015-12-25 MED ORDER — CHLORHEXIDINE GLUCONATE 4 % EX LIQD: 60.0000 mL | Freq: Once | CUTANEOUS | Status: DC

## 2015-12-25 MED ORDER — HYDROCODONE-ACETAMINOPHEN 5-325 MG PO TABS: 1.0000 | ORAL_TABLET | Freq: Four times a day (QID) | ORAL | Status: DC | PRN

## 2015-12-25 MED ORDER — HYDROMORPHONE HCL 1 MG/ML IJ SOLN: 0.2500 mg | INTRAMUSCULAR | Status: DC | PRN

## 2015-12-25 MED ORDER — DEXTROSE 5 % IV SOLN
2.0000 g | INTRAVENOUS | Status: AC
  Administered 2015-12-25: 2 g via INTRAVENOUS

## 2015-12-25 MED ORDER — ONDANSETRON HCL 4 MG/2ML IJ SOLN
INTRAMUSCULAR | Status: AC
  Filled 2015-12-25: qty 2

## 2015-12-25 MED ORDER — PROMETHAZINE HCL 25 MG/ML IJ SOLN: 6.2500 mg | INTRAMUSCULAR | Status: DC | PRN

## 2015-12-25 MED ORDER — ONDANSETRON HCL 4 MG/2ML IJ SOLN
INTRAMUSCULAR | Status: DC | PRN
  Administered 2015-12-25: 4 mg via INTRAVENOUS

## 2015-12-25 MED ORDER — SCOPOLAMINE 1 MG/3DAYS TD PT72
1.0000 | MEDICATED_PATCH | Freq: Once | TRANSDERMAL | Status: DC | PRN
  Administered 2015-12-25: 1.5 mg via TRANSDERMAL

## 2015-12-25 MED ORDER — LIDOCAINE HCL (CARDIAC) 20 MG/ML IV SOLN
INTRAVENOUS | Status: DC | PRN
  Administered 2015-12-25: 20 mg via INTRAVENOUS

## 2015-12-25 MED ORDER — FENTANYL CITRATE (PF) 100 MCG/2ML IJ SOLN
50.0000 ug | INTRAMUSCULAR | Status: DC | PRN
  Administered 2015-12-25 (×2): 50 ug via INTRAVENOUS

## 2015-12-25 MED ORDER — DEXAMETHASONE SODIUM PHOSPHATE 10 MG/ML IJ SOLN
INTRAMUSCULAR | Status: DC | PRN
  Administered 2015-12-25: 10 mg via INTRAVENOUS

## 2015-12-25 SURGICAL SUPPLY — 51 items
BANDAGE COBAN STERILE 2 (GAUZE/BANDAGES/DRESSINGS) IMPLANT
BLADE MINI RND TIP GREEN BEAV (BLADE) ×3 IMPLANT
BLADE SURG 15 STRL LF DISP TIS (BLADE) ×1 IMPLANT
BLADE SURG 15 STRL SS (BLADE) ×2
BNDG COHESIVE 1X5 TAN STRL LF (GAUZE/BANDAGES/DRESSINGS) IMPLANT
BNDG COHESIVE 3X5 TAN STRL LF (GAUZE/BANDAGES/DRESSINGS) IMPLANT
BNDG ESMARK 4X9 LF (GAUZE/BANDAGES/DRESSINGS) IMPLANT
BNDG GAUZE ELAST 4 BULKY (GAUZE/BANDAGES/DRESSINGS) IMPLANT
CHLORAPREP W/TINT 26ML (MISCELLANEOUS) ×3 IMPLANT
CORDS BIPOLAR (ELECTRODE) ×3 IMPLANT
COVER BACK TABLE 60X90IN (DRAPES) ×3 IMPLANT
COVER MAYO STAND STRL (DRAPES) ×3 IMPLANT
CUFF TOURNIQUET SINGLE 18IN (TOURNIQUET CUFF) IMPLANT
DECANTER SPIKE VIAL GLASS SM (MISCELLANEOUS) IMPLANT
DRAIN PENROSE 1/2X12 LTX STRL (WOUND CARE) IMPLANT
DRAPE EXTREMITY T 121X128X90 (DRAPE) ×3 IMPLANT
DRAPE SURG 17X23 STRL (DRAPES) ×6 IMPLANT
GAUZE SPONGE 4X4 12PLY STRL (GAUZE/BANDAGES/DRESSINGS) ×3 IMPLANT
GAUZE XEROFORM 1X8 LF (GAUZE/BANDAGES/DRESSINGS) ×3 IMPLANT
GLOVE BIO SURGEON STRL SZ 6.5 (GLOVE) ×4 IMPLANT
GLOVE BIO SURGEONS STRL SZ 6.5 (GLOVE) ×2
GLOVE BIOGEL PI IND STRL 7.0 (GLOVE) ×2 IMPLANT
GLOVE BIOGEL PI IND STRL 8.5 (GLOVE) ×1 IMPLANT
GLOVE BIOGEL PI INDICATOR 7.0 (GLOVE) ×4
GLOVE BIOGEL PI INDICATOR 8.5 (GLOVE) ×2
GLOVE SURG ORTHO 8.0 STRL STRW (GLOVE) ×3 IMPLANT
GLOVE SURG SS PI 7.0 STRL IVOR (GLOVE) ×3 IMPLANT
GOWN STRL REUS W/ TWL LRG LVL3 (GOWN DISPOSABLE) ×2 IMPLANT
GOWN STRL REUS W/TWL LRG LVL3 (GOWN DISPOSABLE) ×4
GOWN STRL REUS W/TWL XL LVL3 (GOWN DISPOSABLE) ×3 IMPLANT
NDL SAFETY ECLIPSE 18X1.5 (NEEDLE) IMPLANT
NEEDLE HYPO 18GX1.5 SHARP (NEEDLE)
NEEDLE PRECISIONGLIDE 27X1.5 (NEEDLE) IMPLANT
NS IRRIG 1000ML POUR BTL (IV SOLUTION) ×3 IMPLANT
PACK BASIN DAY SURGERY FS (CUSTOM PROCEDURE TRAY) ×3 IMPLANT
PAD CAST 3X4 CTTN HI CHSV (CAST SUPPLIES) IMPLANT
PADDING CAST ABS 4INX4YD NS (CAST SUPPLIES)
PADDING CAST ABS COTTON 4X4 ST (CAST SUPPLIES) IMPLANT
PADDING CAST COTTON 3X4 STRL (CAST SUPPLIES)
SHEET MEDIUM DRAPE 40X70 STRL (DRAPES) ×3 IMPLANT
SPLINT FINGER 4.25 BULB 911906 (SOFTGOODS) ×3 IMPLANT
SPLINT PLASTER CAST XFAST 3X15 (CAST SUPPLIES) IMPLANT
SPLINT PLASTER XTRA FASTSET 3X (CAST SUPPLIES)
STOCKINETTE 4X48 STRL (DRAPES) ×3 IMPLANT
SUT CHROMIC 6 0 G 1 (SUTURE) IMPLANT
SUT ETHILON 4 0 PS 2 18 (SUTURE) ×6 IMPLANT
SUT VIC AB 4-0 P2 18 (SUTURE) ×3 IMPLANT
SYR BULB 3OZ (MISCELLANEOUS) ×3 IMPLANT
SYR CONTROL 10ML LL (SYRINGE) IMPLANT
TOWEL OR 17X24 6PK STRL BLUE (TOWEL DISPOSABLE) ×6 IMPLANT
UNDERPAD 30X30 (UNDERPADS AND DIAPERS) ×3 IMPLANT

## 2015-12-25 NOTE — Progress Notes (Signed)
Assisted Dr. Rob Fitzgerald with right, ultrasound guided, axillary block. Side rails up, monitors on throughout procedure. See vital signs in flow sheet. Tolerated Procedure well. 

## 2015-12-25 NOTE — Discharge Instructions (Addendum)

## 2015-12-25 NOTE — Anesthesia Procedure Notes (Addendum)
Procedure Name: LMA Insertion Performed by: Lance CoonWEBSTER, [place] Pre-anesthesia Checklist: Patient identified, Emergency Drugs available, Suction available and Patient being monitored Patient Re-evaluated:Patient Re-evaluated prior to inductionOxygen Delivery Method: Circle System Utilized Preoxygenation: Pre-oxygenation with 100% oxygen Intubation Type: IV induction Ventilation: Mask ventilation without difficulty LMA: LMA inserted LMA Size: 4.0 Number of attempts: 1 Airway Equipment and Method: Bite block Placement Confirmation: positive ETCO2 Tube secured with: Tape Dental Injury: Teeth and Oropharynx as per pre-operative assessment    Anesthesia Regional Block:  Axillary brachial plexus block  Pre-Anesthetic Checklist: ,, timeout performed, Correct Patient, Correct Site, Correct Laterality, Correct Procedure, Correct Position, site marked, Risks and benefits discussed,  Surgical consent,  Pre-op evaluation,  At surgeon's request and post-op pain management  Laterality: Right  Prep: chloraprep       Needles:   Needle Type: Echogenic Stimulator Needle     Needle Length: 9cm 9 cm Needle Gauge: 21 and 21 G    Additional Needles:  Procedures: ultrasound guided (picture in chart) and nerve stimulator Axillary brachial plexus block  Nerve Stimulator or Paresthesia:  Response: radial, 0.5 mA,  Response: median, 0.5 mA,  Response: ulnar, 0.5 mA,   Additional Responses:   Narrative:  Start time: 12/25/2015 8:13 AM End time: 12/25/2015 8:21 AM  Performed by: Personally  Anesthesiologist: Marcene DuosFITZGERALD, Vishnu Moeller  Additional Notes: Risks and benefits discussed. Pt tolerated well with no immediate complications.

## 2015-12-25 NOTE — Op Note (Signed)
:   Dictation Number [id number]

## 2015-12-25 NOTE — Anesthesia Preprocedure Evaluation (Addendum)
Anesthesia Evaluation  Patient identified by MRN, date of birth, ID band Patient awake    Reviewed: Allergy & Precautions, NPO status , Patient's Chart, lab work & pertinent test results  Airway Mallampati: II  TM Distance: >3 FB Neck ROM: Full    Dental  (+) Dental Advisory Given   Pulmonary Current Smoker,    breath sounds clear to auscultation       Cardiovascular negative cardio ROS   Rhythm:Regular Rate:Normal     Neuro/Psych negative neurological ROS     GI/Hepatic negative GI ROS, Neg liver ROS,   Endo/Other  Morbid obesity  Renal/GU negative Renal ROS     Musculoskeletal   Abdominal   Peds  Hematology negative hematology ROS (+)   Anesthesia Other Findings   Reproductive/Obstetrics                             Anesthesia Physical Anesthesia Plan  ASA: III  Anesthesia Plan: General and Regional   Post-op Pain Management: GA combined w/ Regional for post-op pain   Induction: Intravenous  Airway Management Planned: Oral ETT and LMA  Additional Equipment:   Intra-op Plan:   Post-operative Plan: Extubation in OR  Informed Consent: I have reviewed the patients History and Physical, chart, labs and discussed the procedure including the risks, benefits and alternatives for the proposed anesthesia with the patient or authorized representative who has indicated his/her understanding and acceptance.   Dental advisory given  Plan Discussed with: CRNA  Anesthesia Plan Comments:         Anesthesia Quick Evaluation

## 2015-12-25 NOTE — Anesthesia Postprocedure Evaluation (Signed)
Anesthesia Post Note  Patient: Kenna GilbertRachel A Scaglione  Procedure(s) Performed: Procedure(s) (LRB): RECONSTRUCTION NAILBED RIGHT THUMB WITH POLLICIS LONGUS GRAFT (Right) RELEASE A-1 PULLEY RIGHT THUMB (Right) EXCISION MASS RIGHT THUMB (Right)  Patient location during evaluation: PACU Anesthesia Type: General Level of consciousness: awake Pain management: pain level controlled Vital Signs Assessment: post-procedure vital signs reviewed and stable Respiratory status: spontaneous breathing Cardiovascular status: stable Postop Assessment: no signs of nausea or vomiting Anesthetic complications: no    Last Vitals:  Filed Vitals:   12/25/15 1030 12/25/15 1100  BP: 100/75 120/68  Pulse: 84 85  Temp:  36.9 C  Resp: 9 16    Last Pain:  Filed Vitals:   12/25/15 1109  PainSc: 3                  Matisha Termine JR,JOHN Jaceion Aday

## 2015-12-25 NOTE — Brief Op Note (Signed)
12/25/2015  9:48 AM  PATIENT:  Jamie Paul  55 y.o. female  PRE-OPERATIVE DIAGNOSIS:  TRUMPET NAIL RIGHT THUMB, MASS METACARPAL PHALANGEAL RIGHT THUMB, TRIGGER RIGHT THUMB  POST-OPERATIVE DIAGNOSIS:  TRUMPET NAIL RIGHT THUMB, MASS METACARPAL PHALANGEAL RIGHT THUMB, TRIGGER RIGHT THUMB  PROCEDURE:  Procedure(s): RECONSTRUCTION NAILBED RIGHT THUMB WITH POLLICIS LONGUS GRAFT (Right) RELEASE A-1 PULLEY RIGHT THUMB (Right) EXCISION MASS RIGHT THUMB (Right)  SURGEON:  Surgeon(s) and Role:    * Cindee SaltGary Tatayana Beshears, MD - Primary  PHYSICIAN ASSISTANT:   ASSISTANTS: none   ANESTHESIA:   regional and general  EBL:  Total I/O In: -  Out: 5 [Blood:5]  BLOOD ADMINISTERED:none  DRAINS: none   LOCAL MEDICATIONS USED:  NONE  SPECIMEN:  Excision  DISPOSITION OF SPECIMEN:  PATHOLOGY  COUNTS:  YES  TOURNIQUET:   Total Tourniquet Time Documented: Upper Arm (Right) - 45 minutes Total: Upper Arm (Right) - 45 minutes   DICTATION: .Other Dictation: Dictation Number [id number]  PLAN OF CARE: Discharge to home after PACU  PATIENT DISPOSITION:  PACU - hemodynamically stable.   D

## 2015-12-25 NOTE — Op Note (Signed)
Jamie Paul, Jamie Paul                ACCOUNT NO.:  [id number]  MEDICAL RECORD NO.:  [id number]  LOCATION:                                 FACILITY:  PHYSICIAN:  Jamie Paul, M.D.            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  OPERATIONS:  Excision of mass, right thumb.  Release of A1 pulley, right thumb.  Reconstruction of trumpet nail with palmaris longus Dermagraft, right thumb.  SURGEON:  Jamie Paul, M.D.  ANESTHESIA:  Supraclavicular block, general.  ANESTHESIOLOGIST:  Jamie Mayo, MD.  PLACE OF SURGERY:  [facility].  HISTORY:  The patient is a 55 year old female with a history of triggering of her right thumb, a mass over the metacarpophalangeal joint.  This is not responded to conservative treatment.  She has elected to undergo surgical release of the A1 pulley, excision of the mass with reconstruction of her Pincer trumpet nail.  She has undergone reconstruction of a trumpet nail on the opposite thumb.  Pre, peri and postoperative course have been discussed along with risks and complications.  She is aware that there is no guarantee with the surgery; possibility of infection; recurrence of injury to arteries, nerves, tendons; incomplete relief of symptoms; dystrophy.  In the preoperative area, the patient is seen, the extremity marked by both patient and surgeon, and antibiotic given.  PROCEDURE IN DETAIL:  The patient was brought to the operating room where a general anesthetic was added to the supraclavicular block given in the preoperative area under the direction of Dr. Sampson Goon.  She was prepped using ChloraPrep, supine  position with the right arm free.  A 3- minute dry time was allowed, time-out taken, confirming the patient and procedure.  The A1 pulley was addressed first.  A transverse incision made over the A1 pulley.  The thumb carried down through the subcutaneous tissue.  A multiloculated large cyst was immediately apparent on the flexor sheath just distal to the A1 pulley.  Retractors were placed protecting the neurovascular bundles radially and ulnarly. Multiloculated cyst was excised, this was approximately 1 cm in diameter.  The A1 pulley was then released on its radial aspect. Tenosynovial tissue proximally was separated.  The specimen was sent to Pathology.  The wound was irrigated and the skin closed with interrupted 4-0 nylon sutures.  A palmaris longus graft was then harvested measuring the distance for the graft on the thumb nail.  The incision was made, carried down through the subcutaneous tissue, one-half of the palmaris longus was then harvested.  The wound was irrigated and closed with interrupted 4-0 nylon sutures.  The graft was approximately 6 cm long. The nail plate was then removed with a Therapist, nutritional.  An incision made distally, a Beaver blade was then passed beneath the skin and the nail matrix was elevated off from the distal phalanx back  to the level of the IP joint.  This was done both radially and ulnarly.  This allowed the nail matrix to be elevated off from the underlying distal phalanx. Separate incision was then made proximally after placing a Emergency planning/management officerWoodson elevator and a tendon Passer to the level.  The incision was made, a monofilament wire was then doubled over used as a hook to take the palmaris longus graft, which was halved and passed our from a proximal- to-distal direction underneath the lateral nail fold.  This was sutured to the skin proximally with figure-of-eight 4-0 Vicryl sutures left distally.  The radial side was done first, the ulnar side done second  in a similar manner, this allowed the nail matrix to be elevated off from the distal phalanx and flattened.  The graft was sutured in proximally with figure-of-eight 4-0 [place]ilene sutures.  This was then brought out and sutured in distally on both radial and ulnar aspects of the thumb. This allowed the nail to lie entirely flat across the distal phalanx. The wounds were irrigated.  A nonadherent gauze was then placed between the dorsal palmar nail fold proximally, a second one placed over this to secure it, dressing placed on the distal forearm in the metacarpophalangeal joint, a sterile compressive dressing and splint was applied to the thumb.  The patient tolerated the procedure well and was taken to the recovery room for observation in satisfactory condition. She will be discharged to home [facility] [place] [facility], on Norco.          ______________________________ Jamie SaltGary Yides Saidi, M.D.     GK/MEDQ  D:  12/25/2015  T:  12/25/2015  Job:  [id number]

## 2015-12-25 NOTE — Transfer of Care (Signed)
Immediate Anesthesia Transfer of Care Note  Patient: Kenna GilbertRachel A Boak  Procedure(s) Performed: Procedure(s): RECONSTRUCTION NAILBED RIGHT THUMB WITH POLLICIS LONGUS GRAFT (Right) RELEASE A-1 PULLEY RIGHT THUMB (Right) EXCISION MASS RIGHT THUMB (Right)  Patient Location: PACU  Anesthesia Type:GA combined with regional for post-op pain  Level of Consciousness: awake and patient cooperative  Airway & Oxygen Therapy: Patient Spontanous Breathing and Patient connected to face mask oxygen  Post-op Assessment: Report given to RN and Post -op Vital signs reviewed and stable  Post vital signs: Reviewed and stable  Last Vitals:  Filed Vitals:   12/25/15 0820 12/25/15 0825  BP:  104/72  Pulse: 88 93  Temp:    Resp: 14 22    Complications: No apparent anesthesia complications

## 2015-12-25 NOTE — H&P (Signed)
Jamie Paul is an 55 y.o. female.   Chief Complaint: mass and nail deformity right thumb catching HPI: Jamie Paul  is a 55yo female right-handed complaining of a mass of the metacarpophalangeal joint of her right thumb along with catching especially in the morning. It has been going on, gradually increasing over the past several months. She recalls no history of injury. She is status post reconstruction of a pincer nail on her left side. She also has this on her right side. This is not her major complaint, however.She states that the nail has been present for years. The mass has been present for the past two years. Since reconstructing her trumpet nail on her left side, she states that the nail is gradually growing out but continues to be curved. She is not complaining of significant pain or discomfort. She feels the mass is enlarging.    Past Medical History  Diagnosis Date  . Obesity   . Seasonal allergies   . Arthritis     knees, ankles, neck, hands  . High cholesterol   . Nail deformity 03/2014    left thumb  . Dental crowns present     x 2 - right side  . PONV (postoperative nausea and vomiting)     Past Surgical History  Procedure Laterality Date  . Anterior cruciate ligament repair  08/2001    left  . Meniscus repair Left 01/1998; 02/1999  . Ankle surgery Right 07/2012    repair of tendons and ligaments  . Cholecystectomy  11/2004  . Laparoscopic incisional / umbilical / ventral hernia repair  04/08/2011    ventral umbilical hernia repair  . Nailbed repair Left 04/19/2014    Procedure: RECONSTRUCTION LEFT THUMB NAILBED WITH PALMARIS LONGUS GRAFT;  Surgeon: Nicki Reaper, MD;  Location: [facility];  Service: Orthopedics;  Laterality: Left;    Family History  Problem Relation Age of Onset  . Heart attack Father   . Arthritis Mother   . Diabetes Mother   . Hyperlipidemia Brother   . Diabetes Brother   . Diabetes Sister    Social History:  reports that she has  been smoking Cigarettes.  She has a 15 pack-year smoking history. She has never used smokeless tobacco. She reports that she drinks alcohol. She reports that she does not use illicit drugs.  Allergies: No Known Allergies  No prescriptions prior to admission    No results found for this or any previous visit (from the past 48 hour(s)).  No results found.   Pertinent items are noted in HPI.  Height  (1.702 m), weight 142.089 kg (313 lb 4 oz).  General appearance: alert, cooperative and appears stated age Head: Normocephalic, without obvious abnormality Neck: no JVD Resp: clear to auscultation bilaterally Cardio: regular rate and rhythm, S1, S2 normal, no murmur, click, rub or gallop GI: soft, non-tender; bowel sounds normal; no masses,  no organomegaly Extremities: catching right thumb, mass  and pincer nail Pulses: 2+ and symmetric Skin: Skin color, texture, turgor normal. No rashes or lesions Neurologic: Grossly normal Incision/Wound: na  Assessment/Plan  DIAGNOSIS: Stenosing tenosynovitis right thumb with trumpet or pincer nail right thumb  PLAN: We recommend reconstruction of her trumpet nail on her right thumb, as was done on her left side. She does have a palmaris longus. We will use that as a graft. We would recommend removal of the mass at the metacarpophalangeal joint with release of the A1 pulley. Pre-, peri-, and post-operative course  has been discussed along with risks and complications. She is aware that there is no guarantee with the surgery; possibility of infection, recurrence, injury to arteries, nerves, or tendons, incomplete relief of symptoms, or dystrophy. She is scheduled for reconstruction of trumpet nail, right thumb, and release of A1 pulley, right thumb, with excision of mass on the palmar aspect of the metacarpophalangeal joint, right thumb, as an outpatient under regional anesthesia.    Jamie Paul R 12/25/2015, 5:36 AM

## 2015-12-26 ENCOUNTER — Encounter (HOSPITAL_BASED_OUTPATIENT_CLINIC_OR_DEPARTMENT_OTHER): Payer: Self-pay | Admitting: Orthopedic Surgery

## 2016-07-07 ENCOUNTER — Other Ambulatory Visit (HOSPITAL_COMMUNITY)
Admission: RE | Admit: 2016-07-07 | Discharge: 2016-07-07 | Disposition: A | Discharge status: Home / Self Care | Payer: BLUE CROSS/BLUE SHIELD | Source: Ambulatory Visit | Attending: Family Medicine | Admitting: Family Medicine

## 2016-07-07 ENCOUNTER — Other Ambulatory Visit: Payer: Self-pay | Admitting: Family Medicine

## 2016-07-07 DIAGNOSIS — Z01411 Encounter for gynecological examination (general) (routine) with abnormal findings: Secondary | ICD-10-CM | POA: Diagnosis not present

## 2016-07-09 LAB — CYTOLOGY - PAP

## 2018-05-04 ENCOUNTER — Other Ambulatory Visit: Payer: Self-pay | Admitting: Family Medicine

## 2018-05-04 ENCOUNTER — Ambulatory Visit
Admission: RE | Admit: 2018-05-04 | Discharge: 2018-05-04 | Disposition: A | Discharge status: Home / Self Care | Payer: BLUE CROSS/BLUE SHIELD | Source: Ambulatory Visit | Attending: Family Medicine | Admitting: Family Medicine

## 2018-05-04 DIAGNOSIS — Z720 Tobacco use: Secondary | ICD-10-CM

## 2018-09-28 ENCOUNTER — Encounter (HOSPITAL_COMMUNITY): Payer: Self-pay | Admitting: *Deleted

## 2018-10-04 NOTE — Progress Notes (Signed)
Please place orders in Epic as patient has a pre-op appointment  on 10/12/2018! Thank you!

## 2018-10-04 NOTE — H&P (Addendum)
TOTAL KNEE ADMISSION H&P  Patient is being admitted for left total knee arthroplasty.  Subjective:  Chief Complaint:left knee pain.  HPI: Jamie Paul, 58 y.o. female, has a history of pain and functional disability in the left knee due to arthritis and has failed non-surgical conservative treatments for greater than 12 weeks to includecorticosteriod injections, viscosupplementation injections, flexibility and strengthening excercises and weight reduction as appropriate.  Onset of symptoms was gradual, starting 5 years ago with gradually worsening course since that time. The patient noted prior procedures on the knee to include  arthroscopy and ACL reconstruction on the left knee(s).  Patient currently rates pain in the left knee(s) at 9 out of 10 with activity. Patient has worsening of pain with activity and weight bearing, pain that interferes with activities of daily living, pain with passive range of motion and joint swelling.  Patient has evidence of subchondral sclerosis, periarticular osteophytes and joint space narrowing by imaging studies. There is no active infection.  There are no active problems to display for this patient.  Past Medical History:  Diagnosis Date  . Arthritis    knees, ankles, neck, hands  . Dental crowns present    x 2 - right side  . High cholesterol   . Nail deformity 03/2014   left thumb  . Obesity   . PONV (postoperative nausea and vomiting)   . Seasonal allergies     Past Surgical History:  Procedure Laterality Date  . ANKLE SURGERY Right 07/2012   repair of tendons and ligaments  . ANTERIOR CRUCIATE LIGAMENT REPAIR  08/2001   left  . CHOLECYSTECTOMY  11/2004  . LAPAROSCOPIC INCISIONAL / UMBILICAL / VENTRAL HERNIA REPAIR  04/08/2011   ventral umbilical hernia repair  . MASS EXCISION Right 12/25/2015   Procedure: EXCISION MASS RIGHT THUMB;  Surgeon: Cindee Salt, MD;  Location: [facility];  Service: Orthopedics;  Laterality: Right;  .  MENISCUS REPAIR Left 01/1998; 02/1999  . NAILBED REPAIR Left 04/19/2014   Procedure: RECONSTRUCTION LEFT THUMB NAILBED WITH PALMARIS LONGUS GRAFT;  Surgeon: Nicki Reaper, MD;  Location: [facility];  Service: Orthopedics;  Laterality: Left;  . NAILBED REPAIR Right 12/25/2015   Procedure: RECONSTRUCTION NAILBED RIGHT THUMB WITH POLLICIS LONGUS GRAFT;  Surgeon: Cindee Salt, MD;  Location: [facility];  Service: Orthopedics;  Laterality: Right;  . TRIGGER FINGER RELEASE Right 12/25/2015   Procedure: RELEASE A-1 PULLEY RIGHT THUMB;  Surgeon: Cindee Salt, MD;  Location: [facility];  Service: Orthopedics;  Laterality: Right;    No current facility-administered medications for this encounter.    Current Outpatient Medications  Medication Sig Dispense Refill Last Dose  . atorvastatin (LIPITOR) 20 MG tablet Take 20 mg by mouth daily.     12/24/2015 at Unknown time  . celecoxib (CELEBREX) 200 MG capsule Take 200 mg by mouth 2 (two) times daily.     Past Week at Unknown time  . ergocalciferol (VITAMIN D2) 50000 UNITS capsule Take 50,000 Units by mouth once a week.     Past Week at Unknown time  . HYDROcodone-acetaminophen (NORCO) 5-325 MG tablet Take 1 tablet by mouth every 6 (six) hours as needed for moderate pain. 30 tablet 0   . Loratadine (CLARITIN PO) Take by mouth daily.     12/25/2015 at Unknown time  . montelukast (SINGULAIR) 10 MG tablet Take 10 mg by mouth at bedtime.     12/25/2015 at Unknown time   No Known Allergies  Social History   Tobacco Use  . Smoking status: Current Every Day Smoker    Packs/day: 0.50    Years: 30.00    Pack years: 15.00    Types: Cigarettes  . Smokeless tobacco: Never Used  Substance Use Topics  . Alcohol use: Yes    Comment: "rarely"    Family History  Problem Relation Age of Onset  . Heart attack Father   . Arthritis Mother   . Diabetes Mother   . Hyperlipidemia Brother   . Diabetes Brother   . Diabetes Sister       Review of Systems  Constitutional: Negative.   HENT: Negative.   Eyes: Negative.   Respiratory: Negative.   Cardiovascular: Negative.   Gastrointestinal: Negative.   Genitourinary: Negative.   Musculoskeletal: Positive for joint pain.  Skin: Negative.   Neurological: Negative.   Endo/Heme/Allergies: Negative.   Psychiatric/Behavioral: Negative.     Objective:  Physical Exam  Constitutional: She is oriented to person, place, and time.  HENT:  Head: Normocephalic.  Eyes: Pupils are equal, round, and reactive to light.  Neck: Normal range of motion.  Cardiovascular: Normal rate and intact distal pulses.  Respiratory: Effort normal.  GI: Soft.  Genitourinary:    Genitourinary Comments: Deferred   Musculoskeletal:     Comments: Limited ROM. Strength is limited. Stable at varus and valgus stress.  Neurological: She is oriented to person, place, and time.  Skin: Skin is warm and dry.  Psychiatric: Her behavior is normal.    Vital signs in last 24 hours: BP: ()/()  Arterial Line BP: ()/()   Labs:   Estimated body mass index is 48.4 kg/m as calculated from the following:   Height as of 12/25/15: 5\' 7"  (1.702 m).   Weight as of 12/25/15: 140.2 kg.   Imaging Review Plain radiographs demonstrate severe degenerative joint disease of the left knee(s). The overall alignment ismild varus. The bone quality appears to be good for age and reported activity level.   Preoperative templating of the joint replacement has been completed, documented, and submitted to the Operating Room personnel in order to optimize intra-operative equipment management.    Patient's anticipated LOS is less than 2 midnights, meeting these requirements: - Younger than 4865 - Lives within 1 hour of care - Has a competent adult at home to recover with post-op recover - NO history of  - Chronic pain requiring opiods  - Diabetes  - Coronary Artery Disease  - Heart failure  - Heart attack  -  Stroke  - DVT/VTE  - Cardiac arrhythmia  - Respiratory Failure/COPD  - Renal failure  - Anemia  - Advanced Liver disease        Assessment/Plan:  End stage arthritis, left knee   The patient history, physical examination, clinical judgment of the provider and imaging studies are consistent with end stage degenerative joint disease of the left knee(s) and total knee arthroplasty is deemed medically necessary. The treatment options including medical management, injection therapy arthroscopy and arthroplasty were discussed at length. The risks and benefits of total knee arthroplasty were presented and reviewed. The risks due to aseptic loosening, infection, stiffness, patella tracking problems, thromboembolic complications and other imponderables were discussed. The patient acknowledged the explanation, agreed to proceed with the plan and consent was signed. Patient is being admitted for inpatient treatment for surgery, pain control, PT, OT, prophylactic antibiotics, VTE prophylaxis, progressive ambulation and ADL's and discharge planning. The patient is planning to be discharged home with  home health services.   Will use IV tranexamic acid. Contraindications and adverse affects of Tranexamic acid discussed in detail. Patient denies any of these at this time and understands the risks and benefits.

## 2018-10-06 ENCOUNTER — Ambulatory Visit: Payer: Self-pay | Admitting: Orthopedic Surgery

## 2018-10-06 ENCOUNTER — Other Ambulatory Visit (HOSPITAL_COMMUNITY): Payer: Self-pay | Admitting: *Deleted

## 2018-10-06 NOTE — Patient Instructions (Addendum)
Jamie Paul  10/06/2018   Your procedure is scheduled on: 10-20-2018  Report to [facility] Main  Entrance  Report to admitting at 530 AM    Call this number if you have problems the morning of surgery [phone]    Remember: Do not eat food or drink liquids :After Midnight. BRUSH YOUR TEETH MORNING OF SURGERY AND RINSE YOUR MOUTH OUT, NO CHEWING GUM CANDY OR MINTS.     Take these medicines the morning of surgery with A SIP OF WATER: ATORVASTATIN (LIPITOR), LIOTHYRONINE (CYTOMEL), LORATADINE (CLARITIN), SINGULAIR, FLONASE NASAL SPRAY, NICODERM PATCH                               You may not have any metal on your body including hair pins and              piercings  Do not wear jewelry, make-up, lotions, powders or perfumes, deodorant             Do not wear nail polish.  Do not shave  48 hours prior to surgery.              Men may shave face and neck.   Do not bring valuables to the hospital. [facility] IS NOT             RESPONSIBLE   FOR VALUABLES.  Contacts, dentures or bridgework may not be worn into surgery.  Leave suitcase in the car. After surgery it may be brought to your room.                 Please read over the following fact sheets you were given: _____________________________________________________________________             [facility] - Preparing for Surgery Before surgery, you can play an important role.  Because skin is not sterile, your skin needs to be as free of germs as possible.  You can reduce the number of germs on your skin by washing with CHG (chlorahexidine gluconate) soap before surgery.  CHG is an antiseptic cleaner which kills germs and bonds with the skin to continue killing germs even after washing. Please DO NOT use if you have an allergy to CHG or antibacterial soaps.  If your skin becomes reddened/irritated stop using the CHG and inform your nurse when you arrive at Short Stay. Do not shave (including legs and  underarms) for at least 48 hours prior to the first CHG shower.  You may shave your face/neck. Please follow these instructions carefully:  1.  Shower with CHG Soap the night before surgery and the  morning of Surgery.  2.  If you choose to wash your hair, wash your hair first as usual with your  normal  shampoo.  3.  After you shampoo, rinse your hair and body thoroughly to remove the  shampoo.                           4.  Use CHG as you would any other liquid soap.  You can apply chg directly  to the skin and wash                       Gently with a scrungie or clean washcloth.  5.  Apply the  CHG Soap to your body ONLY FROM THE NECK DOWN.   Do not use on face/ open                           Wound or open sores. Avoid contact with eyes, ears mouth and genitals (private parts).                       Wash face,  Genitals (private parts) with your normal soap.             6.  Wash thoroughly, paying special attention to the area where your surgery  will be performed.  7.  Thoroughly rinse your body with warm water from the neck down.  8.  DO NOT shower/wash with your normal soap after using and rinsing off  the CHG Soap.                9.  Pat yourself dry with a clean towel.            10.  Wear clean pajamas.            11.  Place clean sheets on your bed the night of your first shower and do not  sleep with pets. Day of Surgery : Do not apply any lotions/deodorants the morning of surgery.  Please wear clean clothes to the hospital/surgery center.  FAILURE TO FOLLOW THESE INSTRUCTIONS MAY RESULT IN THE CANCELLATION OF YOUR SURGERY PATIENT SIGNATURE_________________________________  NURSE SIGNATURE__________________________________  ________________________________________________________________________

## 2018-10-11 NOTE — Progress Notes (Signed)
Chest xray [id number] epic

## 2018-10-12 ENCOUNTER — Encounter (HOSPITAL_COMMUNITY)
Admission: RE | Admit: 2018-10-12 | Discharge: 2018-10-12 | Disposition: A | Discharge status: Home / Self Care | Source: Ambulatory Visit | Attending: Specialist | Admitting: Specialist

## 2018-10-12 ENCOUNTER — Encounter (HOSPITAL_COMMUNITY): Payer: Self-pay

## 2018-10-12 ENCOUNTER — Other Ambulatory Visit: Payer: Self-pay

## 2018-10-12 DIAGNOSIS — M1712 Unilateral primary osteoarthritis, left knee: Secondary | ICD-10-CM | POA: Diagnosis not present

## 2018-10-12 DIAGNOSIS — Z01812 Encounter for preprocedural laboratory examination: Secondary | ICD-10-CM | POA: Diagnosis not present

## 2018-10-12 LAB — URINALYSIS, ROUTINE W REFLEX MICROSCOPIC
Bilirubin Urine: NEGATIVE
Glucose, UA: NEGATIVE mg/dL
Ketones, ur: 5 mg/dL — AB
Nitrite: NEGATIVE
Protein, ur: NEGATIVE mg/dL
Specific Gravity, Urine: 1.013 (ref 1.005–1.030)
pH: 6 (ref 5.0–8.0)

## 2018-10-12 LAB — SURGICAL PCR SCREEN
MRSA, PCR: POSITIVE — AB
STAPHYLOCOCCUS AUREUS: POSITIVE — AB

## 2018-10-12 LAB — CBC
HCT: 49.3 % — ABNORMAL HIGH (ref 36.0–46.0)
HEMOGLOBIN: 15.5 g/dL — AB (ref 12.0–15.0)
MCH: 28.8 pg (ref 26.0–34.0)
MCHC: 31.4 g/dL (ref 30.0–36.0)
MCV: 91.6 fL (ref 80.0–100.0)
Platelets: 241 10*3/uL (ref 150–400)
RBC: 5.38 MIL/uL — ABNORMAL HIGH (ref 3.87–5.11)
RDW: 13.7 % (ref 11.5–15.5)
WBC: 7.7 10*3/uL (ref 4.0–10.5)
nRBC: 0 % (ref 0.0–0.2)

## 2018-10-12 LAB — BASIC METABOLIC PANEL
Anion gap: 9 (ref 5–15)
BUN: 15 mg/dL (ref 6–20)
CO2: 24 mmol/L (ref 22–32)
CREATININE: 0.59 mg/dL (ref 0.44–1.00)
Calcium: 9.5 mg/dL (ref 8.9–10.3)
Chloride: 105 mmol/L (ref 98–111)
GFR calc Af Amer: >60 mL/min (ref >60)
GLUCOSE: 97 mg/dL (ref 70–99)
Potassium: 4.7 mmol/L (ref 3.5–5.1)
Sodium: 138 mmol/L (ref 135–145)

## 2018-10-12 LAB — APTT: APTT: 29 s (ref 24–36)

## 2018-10-12 LAB — PROTIME-INR
INR: 0.88
Prothrombin Time: 11.9 seconds (ref 11.4–15.2)

## 2018-10-12 NOTE — Progress Notes (Signed)
Anesthesia Consult   I was asked to review pt's lab results due to abnormal urinalysis.  Results called into Dr. Thomasena Edis office, spoke with scheduler who reports she will let his PA know.    Urinalysis    Component Value Date/Time   COLORURINE YELLOW 10/12/2018 0828   APPEARANCEUR HAZY (A) 10/12/2018 0828   LABSPEC 1.013 10/12/2018 0828   PHURINE 6.0 10/12/2018 0828   GLUCOSEU NEGATIVE 10/12/2018 0828   HGBUR SMALL (A) 10/12/2018 0828   BILIRUBINUR NEGATIVE 10/12/2018 0828   KETONESUR 5 (A) 10/12/2018 0828   PROTEINUR NEGATIVE 10/12/2018 0828   UROBILINOGEN 1.0 08/09/2010 2138   NITRITE NEGATIVE 10/12/2018 0828   LEUKOCYTESUR SMALL (A) 10/12/2018 0828    Jodell Cipro, PA-C [facility] Pre-Surgical Testing [phone] 10/12/18 3:32 PM

## 2018-10-12 NOTE — Progress Notes (Signed)
PCP: Mellony Danziger walters  CARDIOLOGIST: none   INFO IN Epic: ua results  INFO ON CHART:  BLOOD THINNERS AND LAST DOSES: none ____________________________________  PATIENT SYMPTOMS AT TIME OF PREOP: none

## 2018-10-19 NOTE — Anesthesia Preprocedure Evaluation (Signed)
Anesthesia Evaluation  Patient identified by MRN, date of birth, ID band Patient awake    Reviewed: Allergy & Precautions, NPO status , Patient's Chart, lab work & pertinent test results  History of Anesthesia Complications (+) PONV and history of anesthetic complications  Airway Mallampati: II  TM Distance: >3 FB Neck ROM: Full    Dental  (+) Dental Advisory Given   Pulmonary Current Smoker,    breath sounds clear to auscultation       Cardiovascular negative cardio ROS   Rhythm:Regular Rate:Normal     Neuro/Psych negative neurological ROS     GI/Hepatic negative GI ROS, Neg liver ROS,   Endo/Other  Morbid obesity  Renal/GU negative Renal ROS     Musculoskeletal  (+) Arthritis ,   Abdominal (+) + obese,   Peds  Hematology negative hematology ROS (+)   Anesthesia Other Findings   Reproductive/Obstetrics                              Anesthesia Physical  Anesthesia Plan  ASA: III  Anesthesia Plan: Spinal   Post-op Pain Management:  Regional for Post-op pain   Induction: Intravenous  PONV Risk Score and Plan: Treatment may vary due to age or medical condition  Airway Management Planned:   Additional Equipment:   Intra-op Plan:   Post-operative Plan: Extubation in OR  Informed Consent: I have reviewed the patients History and Physical, chart, labs and discussed the procedure including the risks, benefits and alternatives for the proposed anesthesia with the patient or authorized representative who has indicated his/her understanding and acceptance.     Dental advisory given  Plan Discussed with: CRNA  Anesthesia Plan Comments:         Anesthesia Quick Evaluation

## 2018-10-20 ENCOUNTER — Inpatient Hospital Stay (HOSPITAL_COMMUNITY)
Admission: RE | Admit: 2018-10-20 | Discharge: 2018-10-22 | DRG: 470 | Disposition: A | Discharge status: Home Health | Attending: Specialist | Admitting: Specialist

## 2018-10-20 ENCOUNTER — Inpatient Hospital Stay (HOSPITAL_COMMUNITY): Admitting: Physician Assistant

## 2018-10-20 ENCOUNTER — Encounter (HOSPITAL_COMMUNITY)
Admission: RE | Disposition: A | Discharge status: Home Health | Payer: Self-pay | Source: Home / Self Care | Attending: Specialist

## 2018-10-20 ENCOUNTER — Other Ambulatory Visit: Payer: Self-pay

## 2018-10-20 ENCOUNTER — Inpatient Hospital Stay (HOSPITAL_COMMUNITY): Admitting: Anesthesiology

## 2018-10-20 ENCOUNTER — Encounter (HOSPITAL_COMMUNITY): Payer: Self-pay | Admitting: *Deleted

## 2018-10-20 DIAGNOSIS — Z96652 Presence of left artificial knee joint: Secondary | ICD-10-CM

## 2018-10-20 DIAGNOSIS — Z6841 Body Mass Index (BMI) 40.0 and over, adult: Secondary | ICD-10-CM

## 2018-10-20 DIAGNOSIS — E7800 Pure hypercholesterolemia, unspecified: Secondary | ICD-10-CM | POA: Diagnosis present

## 2018-10-20 DIAGNOSIS — M1712 Unilateral primary osteoarthritis, left knee: Secondary | ICD-10-CM | POA: Diagnosis present

## 2018-10-20 DIAGNOSIS — Z79899 Other long term (current) drug therapy: Secondary | ICD-10-CM

## 2018-10-20 DIAGNOSIS — F17210 Nicotine dependence, cigarettes, uncomplicated: Secondary | ICD-10-CM | POA: Diagnosis present

## 2018-10-20 DIAGNOSIS — Z96659 Presence of unspecified artificial knee joint: Secondary | ICD-10-CM

## 2018-10-20 HISTORY — PX: TOTAL KNEE ARTHROPLASTY: SHX125

## 2018-10-20 SURGERY — ARTHROPLASTY, KNEE, TOTAL
Anesthesia: Spinal | Laterality: Left

## 2018-10-20 MED ORDER — CHLORHEXIDINE GLUCONATE 4 % EX LIQD: 60.0000 mL | Freq: Once | CUTANEOUS | Status: DC

## 2018-10-20 MED ORDER — SCOPOLAMINE 1 MG/3DAYS TD PT72
MEDICATED_PATCH | TRANSDERMAL | Status: DC | PRN
  Administered 2018-10-20: 1 via TRANSDERMAL

## 2018-10-20 MED ORDER — LACTATED RINGERS IV SOLN
INTRAVENOUS | Status: DC
  Administered 2018-10-20 (×2): via INTRAVENOUS

## 2018-10-20 MED ORDER — FENTANYL CITRATE (PF) 100 MCG/2ML IJ SOLN
INTRAMUSCULAR | Status: DC | PRN
  Administered 2018-10-20: 50 ug via INTRAVENOUS

## 2018-10-20 MED ORDER — FERROUS SULFATE 325 (65 FE) MG PO TABS
325.0000 mg | ORAL_TABLET | Freq: Three times a day (TID) | ORAL | Status: DC
  Administered 2018-10-21 – 2018-10-22 (×4): 325 mg via ORAL
  Filled 2018-10-20 (×4): qty 1

## 2018-10-20 MED ORDER — PROPOFOL 10 MG/ML IV BOLUS
INTRAVENOUS | Status: AC
  Filled 2018-10-20: qty 20

## 2018-10-20 MED ORDER — HYDROMORPHONE HCL 1 MG/ML IJ SOLN: 0.5000 mg | INTRAMUSCULAR | Status: DC | PRN

## 2018-10-20 MED ORDER — PROMETHAZINE HCL 25 MG/ML IJ SOLN: 6.2500 mg | INTRAMUSCULAR | Status: DC | PRN

## 2018-10-20 MED ORDER — PHENOL 1.4 % MT LIQD
1.0000 | OROMUCOSAL | Status: DC | PRN
  Filled 2018-10-20: qty 177

## 2018-10-20 MED ORDER — VANCOMYCIN HCL IN DEXTROSE 1-5 GM/200ML-% IV SOLN
1000.0000 mg | Freq: Two times a day (BID) | INTRAVENOUS | Status: AC
  Administered 2018-10-20: 1000 mg via INTRAVENOUS
  Filled 2018-10-20: qty 200

## 2018-10-20 MED ORDER — METHOCARBAMOL 500 MG PO TABS
500.0000 mg | ORAL_TABLET | Freq: Four times a day (QID) | ORAL | Status: DC | PRN
  Administered 2018-10-20 – 2018-10-21 (×3): 500 mg via ORAL
  Filled 2018-10-20 (×3): qty 1

## 2018-10-20 MED ORDER — METHOCARBAMOL 500 MG PO TABS: 500.0000 mg | ORAL_TABLET | Freq: Three times a day (TID) | ORAL | 1 refills | Status: AC | PRN

## 2018-10-20 MED ORDER — FENTANYL CITRATE (PF) 100 MCG/2ML IJ SOLN
INTRAMUSCULAR | Status: AC
  Filled 2018-10-20: qty 2

## 2018-10-20 MED ORDER — METOCLOPRAMIDE HCL 5 MG PO TABS: 5.0000 mg | ORAL_TABLET | Freq: Three times a day (TID) | ORAL | Status: DC | PRN

## 2018-10-20 MED ORDER — ENOXAPARIN SODIUM 30 MG/0.3ML ~~LOC~~ SOLN
30.0000 mg | Freq: Two times a day (BID) | SUBCUTANEOUS | Status: DC
  Administered 2018-10-21 (×2): 30 mg via SUBCUTANEOUS
  Filled 2018-10-20 (×3): qty 0.3

## 2018-10-20 MED ORDER — ONDANSETRON HCL 4 MG/2ML IJ SOLN
INTRAMUSCULAR | Status: DC | PRN
  Administered 2018-10-20: 4 mg via INTRAVENOUS

## 2018-10-20 MED ORDER — ATORVASTATIN CALCIUM 20 MG PO TABS
20.0000 mg | ORAL_TABLET | Freq: Every day | ORAL | Status: DC
  Administered 2018-10-21 – 2018-10-22 (×2): 20 mg via ORAL
  Filled 2018-10-20 (×2): qty 1

## 2018-10-20 MED ORDER — BUPIVACAINE-EPINEPHRINE 0.25% -1:200000 IJ SOLN
INTRAMUSCULAR | Status: DC | PRN
  Administered 2018-10-20: 30 mL

## 2018-10-20 MED ORDER — DEXAMETHASONE SODIUM PHOSPHATE 10 MG/ML IJ SOLN
10.0000 mg | Freq: Once | INTRAMUSCULAR | Status: AC
  Administered 2018-10-21: 10 mg via INTRAVENOUS
  Filled 2018-10-20: qty 1

## 2018-10-20 MED ORDER — KETOROLAC TROMETHAMINE 30 MG/ML IJ SOLN
INTRAMUSCULAR | Status: AC
  Filled 2018-10-20: qty 1

## 2018-10-20 MED ORDER — METHOCARBAMOL 500 MG IVPB - SIMPLE MED
500.0000 mg | Freq: Four times a day (QID) | INTRAVENOUS | Status: DC | PRN
  Filled 2018-10-20: qty 50

## 2018-10-20 MED ORDER — HYDROMORPHONE HCL 1 MG/ML IJ SOLN: 0.2500 mg | INTRAMUSCULAR | Status: DC | PRN

## 2018-10-20 MED ORDER — BUPIVACAINE-EPINEPHRINE (PF) 0.25% -1:200000 IJ SOLN
INTRAMUSCULAR | Status: AC
  Filled 2018-10-20: qty 60

## 2018-10-20 MED ORDER — MIDAZOLAM HCL 5 MG/5ML IJ SOLN
INTRAMUSCULAR | Status: DC | PRN
  Administered 2018-10-20: 2 mg via INTRAVENOUS

## 2018-10-20 MED ORDER — MIDAZOLAM HCL 2 MG/2ML IJ SOLN
INTRAMUSCULAR | Status: AC
  Filled 2018-10-20: qty 2

## 2018-10-20 MED ORDER — METOCLOPRAMIDE HCL 5 MG/ML IJ SOLN: 5.0000 mg | Freq: Three times a day (TID) | INTRAMUSCULAR | Status: DC | PRN

## 2018-10-20 MED ORDER — PROPOFOL 500 MG/50ML IV EMUL
INTRAVENOUS | Status: DC | PRN
  Administered 2018-10-20: 40 ug/kg/min via INTRAVENOUS

## 2018-10-20 MED ORDER — SODIUM CHLORIDE 0.9 % IR SOLN
Status: DC | PRN
  Administered 2018-10-20: 3000 mL

## 2018-10-20 MED ORDER — ACETAMINOPHEN 500 MG PO TABS
1000.0000 mg | ORAL_TABLET | Freq: Four times a day (QID) | ORAL | Status: AC
  Administered 2018-10-20 – 2018-10-21 (×4): 1000 mg via ORAL
  Filled 2018-10-20 (×4): qty 2

## 2018-10-20 MED ORDER — ONDANSETRON HCL 4 MG PO TABS: 4.0000 mg | ORAL_TABLET | Freq: Four times a day (QID) | ORAL | Status: DC | PRN

## 2018-10-20 MED ORDER — ACETAMINOPHEN 325 MG PO TABS
325.0000 mg | ORAL_TABLET | Freq: Four times a day (QID) | ORAL | Status: DC | PRN
  Administered 2018-10-21 – 2018-10-22 (×3): 650 mg via ORAL
  Filled 2018-10-20 (×3): qty 2

## 2018-10-20 MED ORDER — DOCUSATE SODIUM 100 MG PO CAPS
100.0000 mg | ORAL_CAPSULE | Freq: Two times a day (BID) | ORAL | Status: DC
  Administered 2018-10-20 – 2018-10-21 (×3): 100 mg via ORAL
  Filled 2018-10-20 (×3): qty 1

## 2018-10-20 MED ORDER — BISACODYL 5 MG PO TBEC: 5.0000 mg | DELAYED_RELEASE_TABLET | Freq: Every day | ORAL | Status: DC | PRN

## 2018-10-20 MED ORDER — CEFAZOLIN SODIUM-DEXTROSE 2-4 GM/100ML-% IV SOLN: 2.0000 g | INTRAVENOUS | Status: DC

## 2018-10-20 MED ORDER — TRANEXAMIC ACID-NACL 1000-0.7 MG/100ML-% IV SOLN
INTRAVENOUS | Status: AC
  Filled 2018-10-20: qty 100

## 2018-10-20 MED ORDER — ONDANSETRON HCL 4 MG/2ML IJ SOLN: 4.0000 mg | Freq: Four times a day (QID) | INTRAMUSCULAR | Status: DC | PRN

## 2018-10-20 MED ORDER — SCOPOLAMINE 1 MG/3DAYS TD PT72
MEDICATED_PATCH | TRANSDERMAL | Status: AC
  Filled 2018-10-20: qty 1

## 2018-10-20 MED ORDER — ALUM & MAG HYDROXIDE-SIMETH 200-200-20 MG/5ML PO SUSP: 30.0000 mL | ORAL | Status: DC | PRN

## 2018-10-20 MED ORDER — KETOROLAC TROMETHAMINE 30 MG/ML IJ SOLN
INTRAMUSCULAR | Status: DC | PRN
  Administered 2018-10-20: 30 mg via INTRAVENOUS

## 2018-10-20 MED ORDER — SODIUM CHLORIDE (PF) 0.9 % IJ SOLN
INTRAMUSCULAR | Status: AC
  Filled 2018-10-20: qty 50

## 2018-10-20 MED ORDER — OXYCODONE HCL 5 MG PO TABS: 10.0000 mg | ORAL_TABLET | ORAL | Status: DC | PRN

## 2018-10-20 MED ORDER — DIPHENHYDRAMINE HCL 12.5 MG/5ML PO ELIX: 12.5000 mg | ORAL_SOLUTION | ORAL | Status: DC | PRN

## 2018-10-20 MED ORDER — VANCOMYCIN HCL 10 G IV SOLR
1500.0000 mg | Freq: Once | INTRAVENOUS | Status: AC
  Administered 2018-10-20: 1500 mg via INTRAVENOUS
  Filled 2018-10-20: qty 1500

## 2018-10-20 MED ORDER — DEXAMETHASONE SODIUM PHOSPHATE 10 MG/ML IJ SOLN
INTRAMUSCULAR | Status: AC
  Filled 2018-10-20: qty 1

## 2018-10-20 MED ORDER — PROPOFOL 10 MG/ML IV BOLUS
INTRAVENOUS | Status: AC
  Filled 2018-10-20: qty 40

## 2018-10-20 MED ORDER — OXYCODONE HCL 5 MG PO TABS
5.0000 mg | ORAL_TABLET | ORAL | Status: DC | PRN
  Administered 2018-10-20: 5 mg via ORAL
  Administered 2018-10-20: 10 mg via ORAL
  Administered 2018-10-21 (×2): 5 mg via ORAL
  Filled 2018-10-20: qty 2
  Filled 2018-10-20 (×3): qty 1

## 2018-10-20 MED ORDER — OXYCODONE HCL 5 MG PO TABS: 5.0000 mg | ORAL_TABLET | ORAL | 0 refills | Status: AC | PRN

## 2018-10-20 MED ORDER — CEFAZOLIN SODIUM-DEXTROSE 2-4 GM/100ML-% IV SOLN
INTRAVENOUS | Status: AC
  Filled 2018-10-20: qty 100

## 2018-10-20 MED ORDER — ONDANSETRON HCL 4 MG/2ML IJ SOLN
INTRAMUSCULAR | Status: AC
  Filled 2018-10-20: qty 2

## 2018-10-20 MED ORDER — LIOTHYRONINE SODIUM 5 MCG PO TABS
10.0000 ug | ORAL_TABLET | Freq: Every day | ORAL | Status: DC
  Administered 2018-10-21 – 2018-10-22 (×2): 10 ug via ORAL
  Filled 2018-10-20 (×2): qty 2

## 2018-10-20 MED ORDER — TRANEXAMIC ACID-NACL 1000-0.7 MG/100ML-% IV SOLN
1000.0000 mg | INTRAVENOUS | Status: AC
Start: 2018-10-20 — End: 2018-10-20
  Administered 2018-10-20: 1000 mg via INTRAVENOUS

## 2018-10-20 MED ORDER — MEPERIDINE HCL 50 MG/ML IJ SOLN: 6.2500 mg | INTRAMUSCULAR | Status: DC | PRN

## 2018-10-20 MED ORDER — SODIUM CHLORIDE 0.9 % IV SOLN
INTRAVENOUS | Status: DC
  Administered 2018-10-20: 75 mL/h via INTRAVENOUS

## 2018-10-20 MED ORDER — MONTELUKAST SODIUM 10 MG PO TABS
10.0000 mg | ORAL_TABLET | Freq: Every day | ORAL | Status: DC
  Administered 2018-10-21 – 2018-10-22 (×2): 10 mg via ORAL
  Filled 2018-10-20 (×2): qty 1

## 2018-10-20 MED ORDER — POLYETHYLENE GLYCOL 3350 17 G PO PACK: 17.0000 g | PACK | Freq: Every day | ORAL | Status: DC | PRN

## 2018-10-20 MED ORDER — SODIUM CHLORIDE (PF) 0.9 % IJ SOLN
INTRAMUSCULAR | Status: DC | PRN
  Administered 2018-10-20: 30 mL via INTRAVENOUS

## 2018-10-20 MED ORDER — MENTHOL 3 MG MT LOZG: 1.0000 | LOZENGE | OROMUCOSAL | Status: DC | PRN

## 2018-10-20 MED ORDER — BUPIVACAINE IN DEXTROSE 0.75-8.25 % IT SOLN
INTRATHECAL | Status: DC | PRN
  Administered 2018-10-20: 2 mL via INTRATHECAL

## 2018-10-20 MED ORDER — ASPIRIN EC 325 MG PO TBEC: 325.0000 mg | DELAYED_RELEASE_TABLET | Freq: Two times a day (BID) | ORAL | 0 refills | Status: AC

## 2018-10-20 MED ORDER — DEXAMETHASONE SODIUM PHOSPHATE 10 MG/ML IJ SOLN
10.0000 mg | Freq: Once | INTRAMUSCULAR | Status: AC
Start: 2018-10-20 — End: 2018-10-20
  Administered 2018-10-20: 8 mg via INTRAVENOUS

## 2018-10-20 MED ORDER — MAGNESIUM CITRATE PO SOLN: 1.0000 | Freq: Once | ORAL | Status: DC | PRN

## 2018-10-20 SURGICAL SUPPLY — 67 items
ATTUNE MED DOME PAT 32 KNEE (Knees) ×2 IMPLANT
ATTUNE MED DOME PAT 32MM KNEE (Knees) ×1 IMPLANT
ATTUNE PSFEM LTSZ6 NARCEM KNEE (Femur) ×3 IMPLANT
ATTUNE PSRP INSR SZ6 5 KNEE (Insert) ×2 IMPLANT
ATTUNE PSRP INSR SZ6 5MM KNEE (Insert) ×1 IMPLANT
BAG DECANTER FOR FLEXI CONT (MISCELLANEOUS) IMPLANT
BAG ZIPLOCK 12X15 (MISCELLANEOUS) ×6 IMPLANT
BANDAGE ACE 4X5 VEL STRL LF (GAUZE/BANDAGES/DRESSINGS) ×3 IMPLANT
BANDAGE ACE 6X5 VEL STRL LF (GAUZE/BANDAGES/DRESSINGS) ×3 IMPLANT
BASE TIBIAL ROT PLAT SZ 5 KNEE (Knees) ×1 IMPLANT
BLADE SAG 18X100X1.27 (BLADE) ×3 IMPLANT
BLADE SAW SGTL 11.0X1.19X90.0M (BLADE) ×3 IMPLANT
BLADE SURG SZ10 CARB STEEL (BLADE) ×6 IMPLANT
BONE CEMENT GENTAMICIN (Cement) ×6 IMPLANT
BOWL SMART MIX CTS (DISPOSABLE) ×3 IMPLANT
CEMENT BONE GENTAMICIN 40 (Cement) ×2 IMPLANT
COVER SURGICAL LIGHT HANDLE (MISCELLANEOUS) ×3 IMPLANT
COVER WAND RF STERILE (DRAPES) IMPLANT
CUFF TOURN SGL QUICK 34 (TOURNIQUET CUFF) ×2
CUFF TRNQT CYL 34X4X40X1 (TOURNIQUET CUFF) ×1 IMPLANT
DECANTER SPIKE VIAL GLASS SM (MISCELLANEOUS) IMPLANT
DERMABOND ADVANCED (GAUZE/BANDAGES/DRESSINGS) ×2
DERMABOND ADVANCED .7 DNX12 (GAUZE/BANDAGES/DRESSINGS) ×1 IMPLANT
DRAPE U-SHAPE 47X51 STRL (DRAPES) ×3 IMPLANT
DRSG AQUACEL AG ADV 3.5X10 (GAUZE/BANDAGES/DRESSINGS) ×3 IMPLANT
DRSG TEGADERM 4X4.75 (GAUZE/BANDAGES/DRESSINGS) ×3 IMPLANT
DURAPREP 26ML APPLICATOR (WOUND CARE) ×6 IMPLANT
ELECT REM PT RETURN 15FT ADLT (MISCELLANEOUS) ×3 IMPLANT
EVACUATOR 1/8 PVC DRAIN (DRAIN) ×3 IMPLANT
GAUZE SPONGE 2X2 8PLY STRL LF (GAUZE/BANDAGES/DRESSINGS) ×1 IMPLANT
GLOVE BIO SURGEON STRL SZ7.5 (GLOVE) ×6 IMPLANT
GLOVE BIOGEL PI IND STRL 8 (GLOVE) ×2 IMPLANT
GLOVE BIOGEL PI INDICATOR 8 (GLOVE) ×4
GLOVE ECLIPSE 8.0 STRL XLNG CF (GLOVE) ×6 IMPLANT
GLOVE SURG ORTHO 9.0 STRL STRW (GLOVE) ×3 IMPLANT
GOWN STRL REUS W/TWL XL LVL3 (GOWN DISPOSABLE) ×6 IMPLANT
HANDPIECE INTERPULSE COAX TIP (DISPOSABLE) ×2
HOLDER FOLEY CATH W/STRAP (MISCELLANEOUS) IMPLANT
NDL SAFETY ECLIPSE 18X1.5 (NEEDLE) ×1 IMPLANT
NEEDLE HYPO 18GX1.5 SHARP (NEEDLE) ×2
NS IRRIG 1000ML POUR BTL (IV SOLUTION) ×3 IMPLANT
PACK TOTAL KNEE CUSTOM (KITS) ×3 IMPLANT
PIN STEINMAN FIXATION KNEE (PIN) ×3 IMPLANT
PIN THREADED HEADED SIGMA (PIN) ×3 IMPLANT
PROTECTOR NERVE ULNAR (MISCELLANEOUS) ×3 IMPLANT
SET HNDPC FAN SPRY TIP SCT (DISPOSABLE) ×1 IMPLANT
SET PAD KNEE POSITIONER (MISCELLANEOUS) ×3 IMPLANT
SPONGE GAUZE 2X2 STER 10/PKG (GAUZE/BANDAGES/DRESSINGS) ×2
SPONGE LAP 18X18 RF (DISPOSABLE) IMPLANT
SPONGE SURGIFOAM ABS GEL 100 (HEMOSTASIS) ×3 IMPLANT
STOCKINETTE 6  STRL (DRAPES) ×2
STOCKINETTE 6 STRL (DRAPES) ×1 IMPLANT
SUT BONE WAX W31G (SUTURE) IMPLANT
SUT MNCRL AB 3-0 PS2 18 (SUTURE) ×3 IMPLANT
SUT VIC AB 1 CT1 27 (SUTURE) ×8
SUT VIC AB 1 CT1 27XBRD ANTBC (SUTURE) ×4 IMPLANT
SUT VIC AB 2-0 CT1 27 (SUTURE) ×4
SUT VIC AB 2-0 CT1 TAPERPNT 27 (SUTURE) ×2 IMPLANT
SUT VLOC 180 0 24IN GS25 (SUTURE) ×3 IMPLANT
SYR 3ML LL SCALE MARK (SYRINGE) ×3 IMPLANT
SYR 50ML LL SCALE MARK (SYRINGE) ×3 IMPLANT
TAPE STRIPS DRAPE STRL (GAUZE/BANDAGES/DRESSINGS) ×3 IMPLANT
TIBIAL BASE ROT PLAT SZ 5 KNEE (Knees) ×3 IMPLANT
TRAY FOLEY MTR SLVR 16FR STAT (SET/KITS/TRAYS/PACK) ×3 IMPLANT
WATER STERILE IRR 1000ML POUR (IV SOLUTION) ×6 IMPLANT
WRAP KNEE MAXI GEL POST OP (GAUZE/BANDAGES/DRESSINGS) ×3 IMPLANT
YANKAUER SUCT BULB TIP 10FT TU (MISCELLANEOUS) ×3 IMPLANT

## 2018-10-20 NOTE — Transfer of Care (Signed)
Immediate Anesthesia Transfer of Care Note  Patient: Jamie Paul  Procedure(s) Performed: TOTAL KNEE ARTHROPLASTY (Left )  Patient Location: PACU  Anesthesia Type:Spinal  Level of Consciousness: awake, alert , oriented and patient cooperative  Airway & Oxygen Therapy: Patient Spontanous Breathing and Patient connected to face mask oxygen  Post-op Assessment: Report given to RN and Post -op Vital signs reviewed and stable  Post vital signs: Reviewed and stable  Last Vitals:  Vitals Value Taken Time  BP 105/64 10/20/2018 10:09 AM  Temp    Pulse 75 10/20/2018 10:11 AM  Resp 15 10/20/2018 10:11 AM  SpO2 90 % 10/20/2018 10:11 AM  Vitals shown include unvalidated device data.  Last Pain:  Vitals:   10/20/18 0607  TempSrc: Oral  PainSc: 3       Patients Stated Pain Goal: 4 (10/20/18 5364)  Complications: No apparent anesthesia complications

## 2018-10-20 NOTE — Interval H&P Note (Signed)
History and Physical Interval Note:  10/20/2018 7:43 AM  Jamie Paul  has presented today for surgery, with the diagnosis of Left knee osteoarthritis  The various methods of treatment have been discussed with the patient and family. After consideration of risks, benefits and other options for treatment, the patient has consented to  Procedure(s): TOTAL KNEE ARTHROPLASTY (Left) as a surgical intervention .  The patient's history has been reviewed, patient examined, no change in status, stable for surgery.  I have reviewed the patient's chart and labs.  Questions were answered to the patient's satisfaction.     Gianno Volner ANDREW

## 2018-10-20 NOTE — Anesthesia Postprocedure Evaluation (Signed)
Anesthesia Post Note  Patient: Jamie Paul  Procedure(s) Performed: TOTAL KNEE ARTHROPLASTY (Left )     Patient location during evaluation: PACU Anesthesia Type: Spinal Level of consciousness: awake and alert Pain management: pain level controlled Vital Signs Assessment: post-procedure vital signs reviewed and stable Respiratory status: spontaneous breathing Cardiovascular status: stable Anesthetic complications: no    Last Vitals:  Vitals:   10/20/18 1252 10/20/18 1343  BP: 96/72 124/79  Pulse: 65 71  Resp: 14 14  Temp:  36.9 C  SpO2: 100% 99%    Last Pain:  Vitals:   10/20/18 1343  TempSrc: Oral  PainSc:                  Lewie Loron

## 2018-10-20 NOTE — Anesthesia Procedure Notes (Addendum)
Anesthesia Regional Block: Adductor canal block   Pre-Anesthetic Checklist: ,, timeout performed, Correct Patient, Correct Site, Correct Laterality, Correct Procedure, Correct Position, site marked, Risks and benefits discussed,  Surgical consent,  Pre-op evaluation,  At surgeon's request and post-op pain management  Laterality: Left  Prep: chloraprep       Needles:  Injection technique: Single-shot  Needle Type: Stimiplex     Needle Length: 9cm  Needle Gauge: 21     Additional Needles:   Procedures:,,,, ultrasound used (permanent image in chart),,,,  Narrative:  Start time: 10/20/2018 7:12 AM End time: 10/20/2018 7:15 AM Injection made incrementally with aspirations every 5 mL.  Performed by: Personally  Anesthesiologist: Lewie Loron, MD  Additional Notes: BP cuff, EKG monitors applied. Sedation begun. Artery and nerve location verified with U/S and anesthetic injected incrementally, slowly, and after negative aspirations under direct u/s guidance. Good fascial /perineural spread. Tolerated well.

## 2018-10-20 NOTE — Anesthesia Procedure Notes (Signed)
Spinal  Patient location during procedure: OR Staffing Anesthesiologist: Nolon Nations, MD Resident/CRNA: Victoriano Lain, CRNA Performed: anesthesiologist  Preanesthetic Checklist Completed: patient identified, site marked, surgical consent, pre-op evaluation, timeout performed, IV checked, risks and benefits discussed and monitors and equipment checked Spinal Block Patient position: sitting Prep: ChloraPrep and site prepped and draped Patient monitoring: heart rate, continuous pulse ox and blood pressure Approach: midline Location: L3-4 Injection technique: single-shot Needle Needle type: Sprotte  Needle gauge: 24 G Needle length: 9 cm Assessment Sensory level: T8 Additional Notes Expiration date of kit checked and confirmed. Patient tolerated procedure well, without complications.

## 2018-10-20 NOTE — Evaluation (Signed)
Physical Therapy Evaluation Patient Details Name: Jamie Paul MRN: [id number] DOB: 01-10-1961 Today's Date: 10/20/2018   History of Present Illness  58 yo female s/p L TKR on 10/20/18. PMH includes OA, obesity, R ankle surgery, hernia repair.   Clinical Impression  Pt presents with L knee pain, decreased L knee ROM, increased time and effort to perform mobility tasks, and decreased activity tolerance due to L knee pain. Pt to benefit from acute PT to address deficits. Pt ambulated 30 ft with RW with min guard assist, verbal cuing provided throughout. Pt educated on ankle pumps (20/hour) to perform this afternoon/evening to increase circulation, to pt's tolerance and limited by pain. PT to progress mobility as tolerated, and will continue to follow acutely.        Follow Up Recommendations Follow surgeon's recommendation for DC plan and follow-up therapies;Supervision for mobility/OOB    Equipment Recommendations  None recommended by PT    Recommendations for Other Services       Precautions / Restrictions Precautions Precautions: Fall Required Braces or Orthoses: Knee Immobilizer - Left Knee Immobilizer - Left: On when out of bed or walking;Discontinue once straight leg raise with < 10 degree lag Restrictions Weight Bearing Restrictions: No Other Position/Activity Restrictions: WBAT       Mobility  Bed Mobility Overal bed mobility: Needs Assistance Bed Mobility: Supine to Sit     Supine to sit: Min guard;HOB elevated     General bed mobility comments: Min guard for safety. Increased time and effort.   Transfers Overall transfer level: Needs assistance Equipment used: Rolling walker (2 wheeled) Transfers: Sit to/from Stand Sit to Stand: Min guard;From elevated surface         General transfer comment: Min guard for safety. Verbal cuing for hand placement, increased time to rise.   Ambulation/Gait Ambulation/Gait assistance: Min guard Gait Distance (Feet): 30  Feet Assistive device: Rolling walker (2 wheeled) Gait Pattern/deviations: Step-to pattern;Decreased stance time - left;Decreased weight shift to left;Antalgic;Trunk flexed Gait velocity: decr    General Gait Details: Min guard for safety, verbal cuing for sequencing, placement in RW, turning.   Stairs            Wheelchair Mobility    Modified Rankin (Stroke Patients Only)       Balance Overall balance assessment: Mild deficits observed, not formally tested                                           Pertinent Vitals/Pain Pain Assessment: 0-10 Pain Score: 3  Pain Location: L knee  Pain Descriptors / Indicators: Sore;Discomfort;Aching Pain Intervention(s): Limited activity within patient's tolerance;Repositioned;Ice applied;Monitored during session;Patient requesting pain meds-RN notified    Home Living Family/patient expects to be discharged to:: Private residence Living Arrangements: Children(son) Available Help at Discharge: Family;Available PRN/intermittently Type of Home: House Home Access: Stairs to enter Entrance Stairs-Rails: None Entrance Stairs-Number of Steps: 1 Home Layout: One level Home Equipment: Tub bench;Hand held shower head;Walker - 2 wheels      Prior Function Level of Independence: Independent               Hand Dominance   Dominant Hand: Right    Extremity/Trunk Assessment   Upper Extremity Assessment Upper Extremity Assessment: Overall WFL for tasks assessed    Lower Extremity Assessment Lower Extremity Assessment: Overall WFL for tasks assessed;LLE deficits/detail LLE Deficits / Details: suspected  post-surgical weakness; able to perform ankle pumps, quad sets, SLR without lift assist and with 5* quad lag, heel slide to 50* limited by pain  LLE Sensation: WNL;decreased light touch(gluteal region with some decreased sensation per pt report)    Cervical / Trunk Assessment Cervical / Trunk Assessment: Normal   Communication   Communication: No difficulties  Cognition Arousal/Alertness: Awake/alert Behavior During Therapy: WFL for tasks assessed/performed Overall Cognitive Status: Within Functional Limits for tasks assessed                                        General Comments      Exercises Total Joint Exercises Goniometric ROM: knee aarom ~5-50*, limited by pain    Assessment/Plan    PT Assessment Patient needs continued PT services  PT Problem List Decreased strength;Pain;Decreased range of motion;Decreased activity tolerance;Decreased knowledge of use of DME;Decreased balance;Decreased mobility;Decreased safety awareness       PT Treatment Interventions DME instruction;Therapeutic activities;Gait training;Therapeutic exercise;Patient/family education;Balance training;Stair training;Functional mobility training    PT Goals (Current goals can be found in the Care Plan section)  Acute Rehab PT Goals Patient Stated Goal: go home  PT Goal Formulation: With patient Time For Goal Achievement: 10/27/18 Potential to Achieve Goals: Good    Frequency 7X/week   Barriers to discharge        Co-evaluation               AM-PAC PT "6 Clicks" Mobility  Outcome Measure Help needed turning from your back to your side while in a flat bed without using bedrails?: A Little Help needed moving from lying on your back to sitting on the side of a flat bed without using bedrails?: A Little Help needed moving to and from a bed to a chair (including a wheelchair)?: A Little Help needed standing up from a chair using your arms (e.g., wheelchair or bedside chair)?: A Little Help needed to walk in hospital room?: A Little Help needed climbing 3-5 steps with a railing? : A Little 6 Click Score: 18    End of Session Equipment Utilized During Treatment: Gait belt;Left knee immobilizer Activity Tolerance: Patient tolerated treatment well;Patient limited by pain Patient left: in  chair;with chair alarm set;with call bell/phone within reach(RN notified SCDs need to be reapplied, pt missing one from operative leg ) Nurse Communication: Mobility status;Patient requests pain meds PT Visit Diagnosis: Other abnormalities of gait and mobility (R26.89);Difficulty in walking, not elsewhere classified (R26.2)    Time: [id number] PT Time Calculation (min) (ACUTE ONLY): 35 min   Charges:   PT Evaluation $PT Eval Low Complexity: 1 Low PT Treatments $Gait Training: 8-22 mins      Nicola PoliceAlexa D Jariah Jarmon, PT Acute Rehabilitation Services Pager [phone]  Office [phone]  Tyrone AppleAlexa D Despina Hiddenure 10/20/2018, 6:38 PM

## 2018-10-20 NOTE — Op Note (Signed)
DATE OF SURGERY:  10/20/2018  TIME: 9:57 AM  PATIENT NAME:  Jamie Paul    AGE: 58 y.o.   PRE-OPERATIVE DIAGNOSIS:  Left knee osteoarthritis  POST-OPERATIVE DIAGNOSIS:  Left knee osteoarthritis  PROCEDURE:  Procedure(s): TOTAL KNEE ARTHROPLASTY  SURGEON:  Dominico Rod ANDREW  ASSISTANT:  Bryson Stilwell, PA-C, present and scrubbed throughout the case, critical for assistance with exposure, retraction, instrumentation, and closure.  OPERATIVE IMPLANTS: Depuy PFC aTTUNE  Rotating Platform.  Femur size 6, Tibia size 5, Patella size 32 3-peg oval button, with a 5 mm polyethylene insert.   PREOPERATIVE INDICATIONS:   Jamie Paul is a 57 y.o. year old female with end stage bone on bone arthritis of the knee who failed conservative treatment and elected for Total Knee Arthroplasty.   The risks, benefits, and alternatives were discussed at length including but not limited to the risks of infection, bleeding, nerve injury, stiffness, blood clots, the need for revision surgery, cardiopulmonary complications, among others, and they were willing to proceed.  OPERATIVE DESCRIPTION:  The patient was brought to the operative room and placed in a supine position.  Spinal anesthesia was administered.  IV antibiotics were given.  The lower extremity was prepped and draped in the usual sterile fashion.  Time out was performed.  The leg was elevated and exsanguinated and the tourniquet was inflated.  Anterior quadriceps tendon splitting approach was performed.  The patella was retracted and osteophytes were removed.  The anterior horn of the medial and lateral meniscus was removed and cruciate ligaments resected.   The distal femur was opened with the drill and the intramedullary distal femoral cutting jig was utilized, set at 5 degrees resecting 10 mm off the distal femur.  Care was taken to protect the collateral ligaments.  The distal femoral sizing jig was applied, taking care to avoid  notching.  Then the 4-in-1 cutting jig was applied and the anterior and posterior femur was cut, along with the chamfer cuts.    Then the extramedullary tibial cutting jig was utilized making the appropriate cut using the anterior tibial crest as a reference building in appropriate posterior slope.  Care was taken during the cut to protect the medial and collateral ligaments.  The proximal tibia was removed along with the posterior horns of the menisci.   The posterior medial femoral osteophytes and posterior lateral femoral osteophytes were removed.    The flexion gap was then measured and was symmetric with the extension gap, measured at 5.  I completed the distal femoral preparation using the appropriate jig to prepare the box.  The patella was then measured, and cut with the saw.    The proximal tibia sized and prepared accordingly with the reamer and the punch, and then all components were trialed with the trial insert.  The knee was found to have excellent balance and full motion.    The above named components were then cemented into place and all excess cement was removed.  The trial polyethylene component was in place during cementation, and then was exchanged for the real polyethylene component.    The knee was easily taken through a range of motion and the patella tracked well and the knee irrigated copiously and the parapatellar and subcutaneous tissue closed with vicryl, and monocryl with steri strips for the skin.  The arthrotomy was closed at 90 of flexion. The wounds were dressed with sterile gauze and the tourniquet released and the patient was awakened and returned to the PACU  in stable and satisfactory condition.  There were no complications.  Total tourniquet time was 80 minutes.

## 2018-10-20 NOTE — Plan of Care (Signed)
Pt is stable. Plan of care reviewed. 

## 2018-10-21 LAB — CBC
HCT: 41.7 % (ref 36.0–46.0)
Hemoglobin: 12.9 g/dL (ref 12.0–15.0)
MCH: 28.9 pg (ref 26.0–34.0)
MCHC: 30.9 g/dL (ref 30.0–36.0)
MCV: 93.5 fL (ref 80.0–100.0)
PLATELETS: 200 10*3/uL (ref 150–400)
RBC: 4.46 MIL/uL (ref 3.87–5.11)
RDW: 13.7 % (ref 11.5–15.5)
WBC: 14.7 10*3/uL — ABNORMAL HIGH (ref 4.0–10.5)
nRBC: 0 % (ref 0.0–0.2)

## 2018-10-21 LAB — BASIC METABOLIC PANEL
Anion gap: 7 (ref 5–15)
BUN: 14 mg/dL (ref 6–20)
CO2: 22 mmol/L (ref 22–32)
Calcium: 8.8 mg/dL — ABNORMAL LOW (ref 8.9–10.3)
Chloride: 110 mmol/L (ref 98–111)
Creatinine, Ser: 0.55 mg/dL (ref 0.44–1.00)
GFR calc Af Amer: >60 mL/min (ref >60)
GFR calc non Af Amer: >60 mL/min (ref >60)
Glucose, Bld: 119 mg/dL — ABNORMAL HIGH (ref 70–99)
Potassium: 4.2 mmol/L (ref 3.5–5.1)
Sodium: 139 mmol/L (ref 135–145)

## 2018-10-21 NOTE — Progress Notes (Signed)
Physical Therapy Treatment Patient Details Name: Jamie Paul MRN: [id number] DOB: [birth year] Today's Date: 10/21/2018    History of Present Illness 58 yo female s/p L TKR on 10/20/18. PMH includes OA, obesity, R ankle surgery, hernia repair.     PT Comments    POD # 1 pm session Assisted with amb a second time (same route) with increased c/o fatigue.  Assisted back to bed to perform a few TE's followed by ICE.  Pt plans to D/C to home tomorrow with help from her son as she lives alone.  No steps to enter home and has a walker.  She was asking about a cane/Alecia aware.   Follow Up Recommendations  Follow surgeon's recommendation for DC plan and follow-up therapies;Supervision for mobility/OOB(Workman's Comp)     Equipment Recommendations       Recommendations for Other Services       Precautions / Restrictions Precautions Precautions: Fall Precaution Comments: instructed on KI use for amb Required Braces or Orthoses: Knee Immobilizer - Left Restrictions Weight Bearing Restrictions: No Other Position/Activity Restrictions: WBAT     Mobility  Bed Mobility Overal bed mobility: Needs Assistance Bed Mobility: Sit to Supine       Sit to supine: Min assist   General bed mobility comments: required increased assist support LE up onto bed  Transfers Overall transfer level: Needs assistance Equipment used: Rolling walker (2 wheeled) Transfers: Sit to/from Stand Sit to Stand: Min guard;From elevated surface         General transfer comment: Min guard for safety. Verbal cuing for hand placement, increased time to rise.   Ambulation/Gait Ambulation/Gait assistance: Min guard Gait Distance (Feet): 95 Feet Assistive device: Rolling walker (2 wheeled) Gait Pattern/deviations: Step-to pattern;Decreased stance time - left;Decreased weight shift to left;Antalgic;Trunk flexed Gait velocity: decreased    General Gait Details: <25% VC's on safety with turns and upright  posture   Stairs             Wheelchair Mobility    Modified Rankin (Stroke Patients Only)       Balance                                            Cognition Arousal/Alertness: Awake/alert Behavior During Therapy: WFL for tasks assessed/performed Overall Cognitive Status: Within Functional Limits for tasks assessed                                        Exercises  10 reps AP, knee presses and HS    General Comments        Pertinent Vitals/Pain Pain Assessment: 0-10 Pain Score: 5  Pain Location: L knee  Pain Descriptors / Indicators: Sore;Discomfort;Aching Pain Intervention(s): Monitored during session;Repositioned;Ice applied;Premedicated before session    Home Living                      Prior Function            PT Goals (current goals can now be found in the care plan section) Progress towards PT goals: Progressing toward goals    Frequency    7X/week      PT Plan Current plan remains appropriate    Co-evaluation  AM-PAC PT "6 Clicks" Mobility   Outcome Measure  Help needed turning from your back to your side while in a flat bed without using bedrails?: A Little Help needed moving from lying on your back to sitting on the side of a flat bed without using bedrails?: A Little Help needed moving to and from a bed to a chair (including a wheelchair)?: A Little Help needed standing up from a chair using your arms (e.g., wheelchair or bedside chair)?: A Little Help needed to walk in hospital room?: A Little Help needed climbing 3-5 steps with a railing? : A Little 6 Click Score: 18    End of Session Equipment Utilized During Treatment: Gait belt;Left knee immobilizer Activity Tolerance: Patient tolerated treatment well Patient left: in bed Nurse Communication: Mobility status;Patient requests pain meds PT Visit Diagnosis: Other abnormalities of gait and mobility  (R26.89);Difficulty in walking, not elsewhere classified (R26.2)     Time: 8638-1771 PT Time Calculation (min) (ACUTE ONLY): 25 min  Charges:  $Gait Training: 8-22 mins $Therapeutic Exercise: 8-22 mins                    Felecia Shelling  PTA Acute  Rehabilitation Services Pager      [phone] Office      [phone]

## 2018-10-21 NOTE — Progress Notes (Signed)
Physical Therapy Treatment Patient Details Name: Jamie Paul MRN: [id number] DOB: [birth year] Today's Date: 10/21/2018    History of Present Illness 58 yo female s/p L TKR on 10/20/18. PMH includes OA, obesity, R ankle surgery, hernia repair.     PT Comments    POD #1 am session removed ICE from [facility] and reinforced. Instructed on KI use for amb.  Demonstrated and instructed pt how to use belt to self assist LE as a leg lifter.  Assisted OOB to amb. General Gait Details: <25% VC's on safety with turns and upright posture.  Returned to room and Then returned to room to perform some TE's following HEP handout.  Instructed on proper tech, freq as well as use of ICE.      Follow Up Recommendations  Follow surgeon's recommendation for DC plan and follow-up therapies;Supervision for mobility/OOB(Workman's Comp)     Equipment Recommendations       Recommendations for Other Services       Precautions / Restrictions Precautions Precautions: Fall Precaution Comments: instructed on KI use for amb Required Braces or Orthoses: Knee Immobilizer - Left Restrictions Weight Bearing Restrictions: No Other Position/Activity Restrictions: WBAT     Mobility  Bed Mobility Overal bed mobility: Needs Assistance Bed Mobility: Supine to Sit           General bed mobility comments: demonstarted and instructed how to use a belt to self assist LE off bed   Transfers Overall transfer level: Needs assistance Equipment used: Rolling walker (2 wheeled) Transfers: Sit to/from Stand Sit to Stand: Min guard;From elevated surface         General transfer comment: Min guard for safety. Verbal cuing for hand placement, increased time to rise.   Ambulation/Gait Ambulation/Gait assistance: Min guard Gait Distance (Feet): 95 Feet Assistive device: Rolling walker (2 wheeled) Gait Pattern/deviations: Step-to pattern;Decreased stance time - left;Decreased weight shift to left;Antalgic;Trunk flexed Gait  velocity: decreased    General Gait Details: <25% VC's on safety with turns and upright posture   Stairs             Wheelchair Mobility    Modified Rankin (Stroke Patients Only)       Balance                                            Cognition Arousal/Alertness: Awake/alert Behavior During Therapy: WFL for tasks assessed/performed Overall Cognitive Status: Within Functional Limits for tasks assessed                                        Exercises   Total Knee Replacement TE's 10 reps B LE ankle pumps 10 reps towel squeezes 10 reps knee presses 10 reps heel slides  10 reps SAQ's 10 reps SLR's 10 reps ABD Followed by ICE     General Comments        Pertinent Vitals/Pain Pain Assessment: 0-10 Pain Score: 5  Pain Location: L knee  Pain Descriptors / Indicators: Sore;Discomfort;Aching Pain Intervention(s): Monitored during session;Repositioned;Ice applied;Premedicated before session    Home Living                      Prior Function            PT Goals (current goals  can now be found in the care plan section) Progress towards PT goals: Progressing toward goals    Frequency    7X/week      PT Plan Current plan remains appropriate    Co-evaluation              AM-PAC PT "6 Clicks" Mobility   Outcome Measure  Help needed turning from your back to your side while in a flat bed without using bedrails?: A Little Help needed moving from lying on your back to sitting on the side of a flat bed without using bedrails?: A Little Help needed moving to and from a bed to a chair (including a wheelchair)?: A Little Help needed standing up from a chair using your arms (e.g., wheelchair or bedside chair)?: A Little Help needed to walk in hospital room?: A Little Help needed climbing 3-5 steps with a railing? : A Little 6 Click Score: 18    End of Session Equipment Utilized During Treatment: Gait  belt;Left knee immobilizer Activity Tolerance: Patient tolerated treatment well Patient left: in chair;with chair alarm set;with call bell/phone within reach Nurse Communication: Mobility status;Patient requests pain meds PT Visit Diagnosis: Other abnormalities of gait and mobility (R26.89);Difficulty in walking, not elsewhere classified (R26.2)     Time: 0930-1010 PT Time Calculation (min) (ACUTE ONLY): 40 min  Charges:  $Gait Training: 8-22 mins $Therapeutic Exercise: 8-22 mins $Self Care/Home Management: 8-22                     Felecia Shelling  PTA Acute  Rehabilitation Services Pager      [phone] Office      [phone]

## 2018-10-21 NOTE — Progress Notes (Signed)
   Subjective: 1 Day Post-Op Procedure(s) (LRB): TOTAL KNEE ARTHROPLASTY (Left)  Pt doing well Minimal pain this morning Denies any new symptoms or issues Patient reports pain as mild.  Objective:   VITALS:   Vitals:   10/21/18 0212 10/21/18 0442  BP: 137/72 (!) 115/56  Pulse: (!) 51 61  Resp: 16 16  Temp: (!) 97.5 F (36.4 C) 98.1 F (36.7 C)  SpO2: 95% 98%    Left knee dressing and immobilizer intact Drain pulled nv intact distally No rashes or edema   LABS Recent Labs    10/21/18 0337  HGB 12.9  HCT 41.7  WBC 14.7*  PLT 200    Recent Labs    10/21/18 0337  NA 139  K 4.2  BUN 14  CREATININE 0.55  GLUCOSE 119*     Assessment/Plan: 1 Day Post-Op Procedure(s) (LRB): TOTAL KNEE ARTHROPLASTY (Left) PT/OT today Probable d/c tomorrow after therapy Pain management Pulmonary toilet    Brad Antonieta Iba, MPAS [facility] is now [facility]  [place] [address], [address], [place], [place] [address] Phone: [phone] [email or web address] Facebook  [employer]

## 2018-10-22 LAB — CBC
HCT: 41.4 % (ref 36.0–46.0)
Hemoglobin: 13 g/dL (ref 12.0–15.0)
MCH: 28.6 pg (ref 26.0–34.0)
MCHC: 31.4 g/dL (ref 30.0–36.0)
MCV: 91 fL (ref 80.0–100.0)
Platelets: 235 10*3/uL (ref 150–400)
RBC: 4.55 MIL/uL (ref 3.87–5.11)
RDW: 14.1 % (ref 11.5–15.5)
WBC: 12.1 10*3/uL — AB (ref 4.0–10.5)
nRBC: 0 % (ref 0.0–0.2)

## 2018-10-22 NOTE — Plan of Care (Signed)
Pt stable this am with no needs. Pt to d/c home today with home health.

## 2018-10-22 NOTE — Progress Notes (Signed)
Physical Therapy Treatment Patient Details Name: Jamie Paul MRN: [id number] DOB: 14-Nov-1960 Today's Date: 10/22/2018    History of Present Illness 58 yo female s/p L TKR on 10/20/18. PMH includes OA, obesity, R ankle surgery, hernia repair.     PT Comments    Pt making excellent progress. Improved fluidity of gait, pt is motivated to continue with HHPT   Follow Up Recommendations  Follow surgeon's recommendation for DC plan and follow-up therapies     Equipment Recommendations  None recommended by PT    Recommendations for Other Services       Precautions / Restrictions Precautions Precautions: Fall;Knee Restrictions Weight Bearing Restrictions: No Other Position/Activity Restrictions: WBAT     Mobility  Bed Mobility               General bed mobility comments: pt in chair  Transfers Overall transfer level: Needs assistance Equipment used: Rolling walker (2 wheeled) Transfers: Sit to/from Stand Sit to Stand: Supervision;Modified independent (Device/Increase time)            Ambulation/Gait Ambulation/Gait assistance: Supervision;Modified independent (Device/Increase time) Gait Distance (Feet): 240 Feet Assistive device: Rolling walker (2 wheeled) Gait Pattern/deviations: Step-to pattern;Step-through pattern;Decreased stride length;Decreased weight shift to left     General Gait Details: cues for heel to toe, step length and progression toward step through gait   Stairs             Wheelchair Mobility    Modified Rankin (Stroke Patients Only)       Balance                                            Cognition Arousal/Alertness: Awake/alert Behavior During Therapy: WFL for tasks assessed/performed Overall Cognitive Status: Within Functional Limits for tasks assessed                                        Exercises Total Joint Exercises Ankle Circles/Pumps: AROM;10 reps;Both Quad Sets:  AROM;Both;10 reps Heel Slides: AAROM;Left;10 reps;Other (comment)(using gait belt) Straight Leg Raises: AROM;Left;10 reps Knee Flexion: AROM;Left;5 reps Goniometric ROM: grossly -8* to 65* knee flexion, AAROM     General Comments        Pertinent Vitals/Pain Pain Assessment: 0-10 Pain Score: 4  Pain Location: L knee  Pain Descriptors / Indicators: Guarding;Tightness;Sore Pain Intervention(s): Limited activity within patient's tolerance;Monitored during session;Premedicated before session;Ice applied    Home Living                      Prior Function            PT Goals (current goals can now be found in the care plan section) Acute Rehab PT Goals Patient Stated Goal: go home  PT Goal Formulation: With patient Time For Goal Achievement: 10/27/18 Potential to Achieve Goals: Good Progress towards PT goals: Progressing toward goals    Frequency    7X/week      PT Plan Current plan remains appropriate    Co-evaluation              AM-PAC PT "6 Clicks" Mobility   Outcome Measure  Help needed turning from your back to your side while in a flat bed without using bedrails?: A Little Help needed moving from lying  on your back to sitting on the side of a flat bed without using bedrails?: A Little Help needed moving to and from a bed to a chair (including a wheelchair)?: A Little Help needed standing up from a chair using your arms (e.g., wheelchair or bedside chair)?: A Little Help needed to walk in hospital room?: A Little Help needed climbing 3-5 steps with a railing? : A Little 6 Click Score: 18    End of Session Equipment Utilized During Treatment: Gait belt Activity Tolerance: Patient tolerated treatment well Patient left: with call bell/phone within reach;in chair Nurse Communication: Mobility status;Patient requests pain meds PT Visit Diagnosis: Other abnormalities of gait and mobility (R26.89);Difficulty in walking, not elsewhere classified  (R26.2)     Time: [id number] PT Time Calculation (min) (ACUTE ONLY): 21 min  Charges:  $Gait Training: 8-22 mins                     Drucilla Chaletara Bobby Barton, PT  Pager: [phone] Acute Rehab Dept [facility]): [phone]   10/22/2018    [facility] CenterWILLIAMS,Liesa Tsan 10/22/2018, 12:49 PM

## 2018-10-22 NOTE — Discharge Summary (Signed)
Physician Discharge Summary   Patient ID: Jamie Paul MRN: [id number] DOB/AGE: [birth year] 58 y.o.  Admit date: 10/20/2018 Discharge date:   Primary Diagnosis: left knee primary osteoarthritis Admission Diagnoses:  Past Medical History:  Diagnosis Date  . Arthritis    knees, ankles, neck, hands  . Dental crowns present    x 2 - right side  . High cholesterol   . Nail deformity 03/2014   left thumb  . Obesity   . PONV (postoperative nausea and vomiting)    likes scopolamine patch  . Seasonal allergies    Discharge Diagnoses:   Active Problems:   S/P TKR (total knee replacement) using cement, left   S/P knee replacement  Estimated body mass index is 40.25 kg/m as calculated from the following:   Height as of this encounter: 5\' 7"  (1.702 m).   Weight as of this encounter: 116.6 kg.  Procedure:  Procedure(s) (LRB): TOTAL KNEE ARTHROPLASTY (Left)   Consults: None  HPI: see H&P Laboratory Data: Admission on 10/20/2018  Component Date Value Ref Range Status  . WBC 10/21/2018 14.7* 4.0 - 10.5 K/uL Final  . RBC 10/21/2018 4.46  3.87 - 5.11 MIL/uL Final  . Hemoglobin 10/21/2018 12.9  12.0 - 15.0 g/dL Final  . HCT 78/29/5621 41.7  36.0 - 46.0 % Final  . MCV 10/21/2018 93.5  80.0 - 100.0 fL Final  . MCH 10/21/2018 28.9  26.0 - 34.0 pg Final  . MCHC 10/21/2018 30.9  30.0 - 36.0 g/dL Final  . RDW 30/86/5784 13.7  11.5 - 15.5 % Final  . Platelets 10/21/2018 200  150 - 400 K/uL Final  . nRBC 10/21/2018 0.0  0.0 - 0.2 % Final   Performed at [facility], [address], [place], [place] [address]  . Sodium 10/21/2018 139  135 - 145 mmol/L Final  . Potassium 10/21/2018 4.2  3.5 - 5.1 mmol/L Final  . Chloride 10/21/2018 110  98 - 111 mmol/L Final  . CO2 10/21/2018 22  22 - 32 mmol/L Final  . Glucose, Bld 10/21/2018 119* 70 - 99 mg/dL Final  . BUN 52/84/1324 14  6 - 20 mg/dL Final  . Creatinine, Ser 10/21/2018 0.55  0.44 - 1.00 mg/dL Final  . Calcium  40/06/2724 8.8* 8.9 - 10.3 mg/dL Final  . GFR calc non Af Amer 10/21/2018 >60  >60 mL/min Final  . GFR calc Af Amer 10/21/2018 >60  >60 mL/min Final  . Anion gap 10/21/2018 7  5 - 15 Final   Performed at [facility], [address], [place], [place] [address]  . WBC 10/22/2018 12.1* 4.0 - 10.5 K/uL Final  . RBC 10/22/2018 4.55  3.87 - 5.11 MIL/uL Final  . Hemoglobin 10/22/2018 13.0  12.0 - 15.0 g/dL Final  . HCT 03/47/4259 41.4  36.0 - 46.0 % Final  . MCV 10/22/2018 91.0  80.0 - 100.0 fL Final  . MCH 10/22/2018 28.6  26.0 - 34.0 pg Final  . MCHC 10/22/2018 31.4  30.0 - 36.0 g/dL Final  . RDW 56/38/7564 14.1  11.5 - 15.5 % Final  . Platelets 10/22/2018 235  150 - 400 K/uL Final  . nRBC 10/22/2018 0.0  0.0 - 0.2 % Final   Performed at [facility], [address], [place], [place] [address]  Hospital Outpatient Visit on 10/12/2018  Component Date Value Ref Range Status  . aPTT 10/12/2018 29  24 - 36 seconds Final   Performed at [facility], [address] [place], [place] [address]  . Sodium 10/12/2018 138  135 - 145 mmol/L Final  . Potassium 10/12/2018 4.7  3.5 - 5.1 mmol/L Final  . Chloride 10/12/2018 105  98 - 111 mmol/L Final  . CO2 10/12/2018 24  22 - 32 mmol/L Final  . Glucose, Bld 10/12/2018 97  70 - 99 mg/dL Final  . BUN 60/45/4098 15  6 - 20 mg/dL Final  . Creatinine, Ser 10/12/2018 0.59  0.44 - 1.00 mg/dL Final  . Calcium 11/91/4782 9.5  8.9 - 10.3 mg/dL Final  . GFR calc non Af Amer 10/12/2018 >60  >60 mL/min Final  . GFR calc Af Amer 10/12/2018 >60  >60 mL/min Final  . Anion gap 10/12/2018 9  5 - 15 Final   Performed at [facility], [address], [place], [place] [address]  . WBC 10/12/2018 7.7  4.0 - 10.5 K/uL Final  . RBC 10/12/2018 5.38* 3.87 - 5.11 MIL/uL Final  . Hemoglobin 10/12/2018 15.5* 12.0 - 15.0 g/dL Final  . HCT 30/86/5784 49.3* 36.0 - 46.0 % Final  . MCV 10/12/2018 91.6  80.0  - 100.0 fL Final  . MCH 10/12/2018 28.8  26.0 - 34.0 pg Final  . MCHC 10/12/2018 31.4  30.0 - 36.0 g/dL Final  . RDW 69/62/9528 13.7  11.5 - 15.5 % Final  . Platelets 10/12/2018 241  150 - 400 K/uL Final  . nRBC 10/12/2018 0.0  0.0 - 0.2 % Final   Performed at [facility], [address] [place], [place] [address]  . Prothrombin Time 10/12/2018 11.9  11.4 - 15.2 seconds Final  . INR 10/12/2018 0.88   Final   Performed at [facility], [address], [place], [place] [address]  . Color, Urine 10/12/2018 YELLOW  YELLOW Final  . APPearance 10/12/2018 HAZY* CLEAR Final  . Specific Gravity, Urine 10/12/2018 1.013  1.005 - 1.030 Final  . pH 10/12/2018 6.0  5.0 - 8.0 Final  . Glucose, UA 10/12/2018 NEGATIVE  NEGATIVE mg/dL Final  . Hgb urine dipstick 10/12/2018 SMALL* NEGATIVE Final  . Bilirubin Urine 10/12/2018 NEGATIVE  NEGATIVE Final  . Ketones, ur 10/12/2018 5* NEGATIVE mg/dL Final  . Protein, ur 72/53/6644 NEGATIVE  NEGATIVE mg/dL Final  . Nitrite 03/47/4259 NEGATIVE  NEGATIVE Final  . Leukocytes, UA 10/12/2018 SMALL* NEGATIVE Final  . RBC / HPF 10/12/2018 0-5  0 - 5 RBC/hpf Final  . WBC, UA 10/12/2018 6-10  0 - 5 WBC/hpf Final  . Bacteria, UA 10/12/2018 RARE* NONE SEEN Final  . Squamous Epithelial / LPF 10/12/2018 6-10  0 - 5 Final  . Mucus 10/12/2018 PRESENT   Final   Performed at [facility], [address], [place], [place] [address]  . MRSA, PCR 10/12/2018 POSITIVE* NEGATIVE Final   Comment: RESULT CALLED TO, READ BACK BY AND VERIFIED WITH: S SHOFFNER,RN [id number] @ 1217 BY J SCOTTON   . Staphylococcus aureus 10/12/2018 POSITIVE* NEGATIVE Final   Comment: (NOTE) The Xpert SA Assay (FDA approved for NASAL specimens in patients 52 years of age and older), is one component of a comprehensive surveillance program. It is not intended to diagnose infection nor to guide or monitor treatment. Performed at [facility], [address], [place], [place] [address]      X-Rays:No results found.  EKG: Orders placed or performed during the hospital encounter of 04/06/11  . EKG  Hospital Course: Kenna GilbertRachel A Bradby is a 58 y.o. who [facility]. They were brought to the operating room on 10/20/2018 and underwent Procedure(s): TOTAL KNEE ARTHROPLASTY.  Patient tolerated the procedure well and was later transferred to the recovery room and then to the orthopaedic floor for postoperative care.  They were given PO and IV analgesics for pain control following their surgery.  They were given 24 hours of postoperative antibiotics of  Anti-infectives (From admission, onward)   Start     Dose/Rate Route Frequency Ordered Stop   10/20/18 2000  vancomycin (VANCOCIN) IVPB 1000 mg/200 mL premix     1,000 mg 200 mL/hr over 60 Minutes Intravenous Every 12 hours 10/20/18 1157 10/20/18 2047   10/20/18 0745  vancomycin (VANCOCIN) 1,500 mg in sodium chloride 0.9 % 500 mL IVPB     1,500 mg 250 mL/hr over 120 Minutes Intravenous  Once 10/20/18 0703 10/20/18 0925   10/20/18 0600  ceFAZolin (ANCEF) IVPB 2g/100 mL premix  Status:  Discontinued     2 g 200 mL/hr over 30 Minutes Intravenous On call to O.R. 10/20/18 0547 10/20/18 1008   10/20/18 0552  ceFAZolin (ANCEF) 2-4 GM/100ML-% IVPB    Note to Pharmacy:  Ramond CraverSpencer, Brittany   : cabinet override      10/20/18 0552 10/20/18 1759     and started on DVT prophylaxis in the form of Lovenox, TED hose and SCDs.   PT and OT were ordered for total joint protocol.  Discharge planning consulted to help with postop disposition and equipment needs.  Patient had a good night on the evening of surgery.  They started to get up OOB with therapy on day one. Hemovac drain was pulled without difficulty.  Continued to work with therapy into day two.   By day two, the patient had progressed with therapy and meeting their goals.  Incision was healing well.  Patient was seen  in rounds and was ready to go home.   Diet: Regular diet Activity:WBAT Follow-up:in 10-14 days Disposition - Home with HHPT Discharged Condition: good   Discharge Instructions    Call MD / Call 911   Complete by:  As directed    If you experience chest pain or shortness of breath, CALL 911 and be transported to the hospital emergency room.  If you develope a fever above 101 F, pus (white drainage) or increased drainage or redness at the wound, or calf pain, call your surgeon's office.   Call MD / Call 911   Complete by:  As directed    If you experience chest pain or shortness of breath, CALL 911 and be transported to the hospital emergency room.  If you develope a fever above 101 F, pus (white drainage) or increased drainage or redness at the wound, or calf pain, call your surgeon's office.   Constipation Prevention   Complete by:  As directed    Drink plenty of fluids.  Prune juice may be helpful.  You may use a stool softener, such as Colace (over the counter) 100 mg twice a day.  Use MiraLax (over the counter) for constipation as needed.   Constipation Prevention   Complete by:  As directed    Drink plenty of fluids.  Prune juice may be helpful.  You may use a stool softener, such as Colace (over the counter) 100 mg twice a day.  Use MiraLax (over the counter) for constipation as needed.   Diet - low sodium heart  healthy   Complete by:  As directed    Diet - low sodium heart healthy   Complete by:  As directed    Discharge instructions   Complete by:  As directed    INSTRUCTIONS AFTER JOINT REPLACEMENT   Remove items at home which could result in a fall. This includes throw rugs or furniture in walking pathways ICE to the affected joint every three hours while awake for 30 minutes at a time, for at least the first 3-5 days, and then as needed for pain and swelling.  Continue to use ice for pain and swelling. You may notice swelling that will progress down to the foot and ankle.   This is normal after surgery.  Elevate your leg when you are not up walking on it.   Continue to use the breathing machine you got in the hospital (incentive spirometer) which will help keep your temperature down.  It is common for your temperature to cycle up and down following surgery, especially at night when you are not up moving around and exerting yourself.  The breathing machine keeps your lungs expanded and your temperature down.   DIET:  As you were doing prior to hospitalization, we recommend a well-balanced diet.  DRESSING / WOUND CARE / SHOWERING  Keep the surgical dressing until follow up.  The dressing is water proof, so you can shower without any extra covering.  IF THE DRESSING FALLS OFF or the wound gets wet inside, change the dressing with sterile gauze.  Please use good hand washing techniques before changing the dressing.  Do not use any lotions or creams on the incision until instructed by your surgeon.    ACTIVITY  Increase activity slowly as tolerated, but follow the weight bearing instructions below.   No driving for 6 weeks or until further direction given by your physician.  You cannot drive while taking narcotics.  No lifting or carrying greater than 10 lbs. until further directed by your surgeon. Avoid periods of inactivity such as sitting longer than an hour when not asleep. This helps prevent blood clots.  You may return to work once you are authorized by your doctor.     WEIGHT BEARING   Weight bearing as tolerated with assist device (walker, cane, etc) as directed, use it as long as suggested by your surgeon or therapist, typically at least 4-6 weeks.   EXERCISES  Results after joint replacement surgery are often greatly improved when you follow the exercise, range of motion and muscle strengthening exercises prescribed by your doctor. Safety measures are also important to protect the joint from further injury. Any time any of these exercises cause you to  have increased pain or swelling, decrease what you are doing until you are comfortable again and then slowly increase them. If you have problems or questions, call your caregiver or physical therapist for advice.   Rehabilitation is important following a joint replacement. After just a few days of immobilization, the muscles of the leg can become weakened and shrink (atrophy).  These exercises are designed to build up the tone and strength of the thigh and leg muscles and to improve motion. Often times heat used for twenty to thirty minutes before working out will loosen up your tissues and help with improving the range of motion but do not use heat for the first two weeks following surgery (sometimes heat can increase post-operative swelling).   These exercises can be done on a training (exercise) mat, on the  floor, on a table or on a bed. Use whatever works the best and is most comfortable for you.    Use music or television while you are exercising so that the exercises are a pleasant break in your day. This will make your life better with the exercises acting as a break in your routine that you can look forward to.   Perform all exercises about fifteen times, three times per day or as directed.  You should exercise both the operative leg and the other leg as well.   Exercises include:   Quad Sets - Tighten up the muscle on the front of the thigh (Quad) and hold for 5-10 seconds.   Straight Leg Raises - With your knee straight (if you were given a brace, keep it on), lift the leg to 60 degrees, hold for 3 seconds, and slowly lower the leg.  Perform this exercise against resistance later as your leg gets stronger.  Leg Slides: Lying on your back, slowly slide your foot toward your buttocks, bending your knee up off the floor (only go as far as is comfortable). Then slowly slide your foot back down until your leg is flat on the floor again.  Angel Wings: Lying on your back spread your legs to the side as  far apart as you can without causing discomfort.  Hamstring Strength:  Lying on your back, push your heel against the floor with your leg straight by tightening up the muscles of your buttocks.  Repeat, but this time bend your knee to a comfortable angle, and push your heel against the floor.  You may put a pillow under the heel to make it more comfortable if necessary.   A rehabilitation program following joint replacement surgery can speed recovery and prevent re-injury in the future due to weakened muscles. Contact your doctor or a physical therapist for more information on knee rehabilitation.    CONSTIPATION  Constipation is defined medically as fewer than three stools per week and severe constipation as less than one stool per week.  Even if you have a regular bowel pattern at home, your normal regimen is likely to be disrupted due to multiple reasons following surgery.  Combination of anesthesia, postoperative narcotics, change in appetite and fluid intake all can affect your bowels.   YOU MUST use at least one of the following options; they are listed in order of increasing strength to get the job done.  They are all available over the counter, and you may need to use some, POSSIBLY even all of these options:    Drink plenty of fluids (prune juice may be helpful) and high fiber foods Colace 100 mg by mouth twice a day  Senokot for constipation as directed and as needed Dulcolax (bisacodyl), take with full glass of water  Miralax (polyethylene glycol) once or twice a day as needed.  If you have tried all these things and are unable to have a bowel movement in the first 3-4 days after surgery call either your surgeon or your primary doctor.    If you experience loose stools or diarrhea, hold the medications until you stool forms back up.  If your symptoms do not get better within 1 week or if they get worse, check with your doctor.  If you experience "the worst abdominal pain ever" or  develop nausea or vomiting, please contact the office immediately for further recommendations for treatment.   ITCHING:  If you experience itching with your medications, try taking  only a single pain pill, or even half a pain pill at a time.  You can also use Benadryl over the counter for itching or also to help with sleep.   TED HOSE STOCKINGS:  Use stockings on both legs until for at least 2 weeks or as directed by physician office. They may be removed at night for sleeping.  MEDICATIONS:  See your medication summary on the "After Visit Summary" that nursing will review with you.  You may have some home medications which will be placed on hold until you complete the course of blood thinner medication.  It is important for you to complete the blood thinner medication as prescribed.  PRECAUTIONS:  If you experience chest pain or shortness of breath - call 911 immediately for transfer to the hospital emergency department.   If you develop a fever greater that 101 F, purulent drainage from wound, increased redness or drainage from wound, foul odor from the wound/dressing, or calf pain - CONTACT YOUR SURGEON.                                                   FOLLOW-UP APPOINTMENTS:  If you do not already have a post-op appointment, please call the office for an appointment to be seen by your surgeon.  Guidelines for how soon to be seen are listed in your "After Visit Summary", but are typically between 1-4 weeks after surgery.  OTHER INSTRUCTIONS:   Knee Replacement:  Do not place pillow under knee, focus on keeping the knee straight while resting. CPM instructions: 0-90 degrees, 2 hours in the morning, 2 hours in the afternoon, and 2 hours in the evening. Place foam block, curve side up under heel at all times except when in CPM or when walking.  DO NOT modify, tear, cut, or change the foam block in any way.  MAKE SURE YOU:  Understand these instructions.  Get help right away if you are not doing  well or get worse.    Thank you for letting us be a part of your medical care team.  It is a privilege we respect greatly.  We hope these instructions will help you stay on track for a fast and full recovery!   Increase activity slowly as tolerated   Complete by:  As directed    Increase activity slowly as tolerated   Complete by:  As directed      Allergies as of 10/22/2018   No Known Allergies     Medication List    STOP taking these medications   celecoxib 200 MG capsule Commonly known as:  CELEBREX     TAKE these medications   aspirin EC 325 MG tablet Take 1 tablet (325 mg total) by mouth 2 (two) times daily.   atorvastatin 20 MG tablet Commonly known as:  LIPITOR Take 20 mg by mouth daily.   CYTOMEL 5 MCG tablet Generic drug:  liothyronine Take 10 mcg by mouth daily.   FLONASE SENSIMIST 27.5 MCG/SPRAY nasal spray Generic drug:  fluticasone Place 2 sprays into the nose daily as needed for allergies.   loratadine 10 MG tablet Commonly known as:  CLARITIN Take 10 mg by mouth daily.   methocarbamol 500 MG tablet Commonly known as:  ROBAXIN Take 1 tablet (500 mg total) by mouth every 8 (eight) hours as needed for  muscle spasms.   montelukast 10 MG tablet Commonly known as:  SINGULAIR Take 10 mg by mouth daily.   MULTIVITAMIN ADULT PO Take 1 packet by mouth daily. Appetite Support Weight Loss Packet Provided by [employer]   nicotine 21 mg/24hr patch Commonly known as:  NICODERM CQ - dosed in mg/24 hours Place 21 mg onto the skin daily. For 2 weeks then will decrease to 14 mg   nicotine polacrilex 4 MG gum Commonly known as:  NICORETTE Take 4 mg by mouth as needed for smoking cessation.   oxyCODONE 5 MG immediate release tablet Commonly known as:  ROXICODONE Take 1 tablet (5 mg total) by mouth every 4 (four) hours as needed for up to 7 days.   phentermine 15 MG capsule Take 15 mg by mouth every morning. Took 1/2 tab   UNABLE TO FIND [employer]  md Appetite support Takes 2 packets per day   Vitamin D-3 125 MCG (5000 UT) Tabs Take 5,000 Units by mouth daily.            Durable Medical Equipment  (From admission, onward)         Start     Ordered   10/21/18 1117  For home use only DME Cane  Once     10/21/18 1117           Signed: Andrez GrimeJaclyn , PA-C Orthopaedic Surgery 10/22/2018, 11:52 AM

## 2018-10-22 NOTE — Progress Notes (Signed)
Subjective: 2 Days Post-Op Procedure(s) (LRB): TOTAL KNEE ARTHROPLASTY (Left) Patient reports pain as 3 on 0-10 scale.    Objective: Vital signs in last 24 hours: Temp:  [97.4 F (36.3 C)-98.8 F (37.1 C)] 98.4 F (36.9 C) (01/26 0511) Pulse Rate:  [61-85] 70 (01/26 0511) Resp:  [16] 16 (01/26 0511) BP: (119-127)/(61-86) 122/86 (01/26 0511) SpO2:  [95 %-96 %] 95 % (01/26 0511)  Intake/Output from previous day: 01/25 0701 - 01/26 0700 In: 946.8 [P.O.:480; I.V.:466.8] Out: 400 [Urine:400] Intake/Output this shift: Total I/O In: 360 [P.O.:360] Out: -   Recent Labs    10/21/18 0337 10/22/18 0338  HGB 12.9 13.0   Recent Labs    10/21/18 0337 10/22/18 0338  WBC 14.7* 12.1*  RBC 4.46 4.55  HCT 41.7 41.4  PLT 200 235   Recent Labs    10/21/18 0337  NA 139  K 4.2  CL 110  CO2 22  BUN 14  CREATININE 0.55  GLUCOSE 119*  CALCIUM 8.8*   No results for input(s): LABPT, INR in the last 72 hours.  Neurologically intact ABD soft Intact pulses distally Dorsiflexion/Plantar flexion intact Incision: dressing C/D/I Compartment soft  No DVT  Assessment/Plan: 2 Days Post-Op Procedure(s) (LRB): TOTAL KNEE ARTHROPLASTY (Left) Advance diet Up with therapy D/C IV fluids Discharge home with home health  D/C instr given    Javier Docker 10/22/2018, 8:18 AM

## 2018-10-22 NOTE — Care Management Note (Signed)
Case Management Note  Patient Details  Name: Jamie Paul MRN: [id number] Date of Birth: December 05, 1960  Subjective/Objective:  Left TKA                   Action/Plan:  NCM spoke to pt and states she has [employer], [employer], Bjorn Loser [phone] ext [phone], claim # [id number].  Is arranging her HH. She is requesting cane. She has RW and shower bench at home. Will call on 1/27 to get fax number. Will fax orders for HHPT, and cane. Will fax dc summary and facesheet.     Expected Discharge Date:  10/22/18               Expected Discharge Plan:  Home w Home Health Services  In-House Referral:  NA  Discharge planning Services  CM Consult  Post Acute Care Choice:  Home Health Choice offered to:  Patient  DME Arranged:  Other see comment DME Agency:  NA  HH Arranged:  PT HH Agency:  Other - See comment  Status of Service:  Completed, signed off  If discussed at Long Length of Stay Meetings, dates discussed:    Additional Comments:  Elliot Cousin, RN 10/22/2018, 1:00 PM

## 2018-10-25 ENCOUNTER — Encounter (HOSPITAL_COMMUNITY): Payer: Self-pay | Admitting: Specialist

## 2018-11-06 ENCOUNTER — Other Ambulatory Visit: Payer: Self-pay | Admitting: Family Medicine

## 2018-11-06 DIAGNOSIS — Z1231 Encounter for screening mammogram for malignant neoplasm of breast: Secondary | ICD-10-CM

## 2018-11-07 ENCOUNTER — Ambulatory Visit
Admission: RE | Admit: 2018-11-07 | Discharge: 2018-11-07 | Disposition: A | Discharge status: Home / Self Care | Payer: Self-pay | Source: Ambulatory Visit | Attending: Family Medicine | Admitting: Family Medicine

## 2018-11-07 DIAGNOSIS — Z1231 Encounter for screening mammogram for malignant neoplasm of breast: Secondary | ICD-10-CM

## 2018-12-01 DIAGNOSIS — E039 Hypothyroidism, unspecified: Secondary | ICD-10-CM | POA: Diagnosis not present

## 2018-12-06 DIAGNOSIS — E039 Hypothyroidism, unspecified: Secondary | ICD-10-CM | POA: Diagnosis not present

## 2018-12-06 DIAGNOSIS — Z6841 Body Mass Index (BMI) 40.0 and over, adult: Secondary | ICD-10-CM | POA: Diagnosis not present

## 2018-12-06 DIAGNOSIS — E8881 Metabolic syndrome: Secondary | ICD-10-CM | POA: Diagnosis not present

## 2018-12-19 DIAGNOSIS — E039 Hypothyroidism, unspecified: Secondary | ICD-10-CM | POA: Diagnosis not present

## 2018-12-19 DIAGNOSIS — Z6841 Body Mass Index (BMI) 40.0 and over, adult: Secondary | ICD-10-CM | POA: Diagnosis not present

## 2018-12-19 DIAGNOSIS — R7303 Prediabetes: Secondary | ICD-10-CM | POA: Diagnosis not present

## 2019-01-17 DIAGNOSIS — E8881 Metabolic syndrome: Secondary | ICD-10-CM | POA: Diagnosis not present

## 2019-01-17 DIAGNOSIS — Z6841 Body Mass Index (BMI) 40.0 and over, adult: Secondary | ICD-10-CM | POA: Diagnosis not present

## 2019-01-31 DIAGNOSIS — Z6841 Body Mass Index (BMI) 40.0 and over, adult: Secondary | ICD-10-CM | POA: Diagnosis not present

## 2019-01-31 DIAGNOSIS — E8881 Metabolic syndrome: Secondary | ICD-10-CM | POA: Diagnosis not present

## 2019-02-28 DIAGNOSIS — B349 Viral infection, unspecified: Secondary | ICD-10-CM | POA: Diagnosis not present

## 2019-07-09 DIAGNOSIS — E039 Hypothyroidism, unspecified: Secondary | ICD-10-CM | POA: Diagnosis not present

## 2019-07-11 DIAGNOSIS — R5383 Other fatigue: Secondary | ICD-10-CM | POA: Diagnosis not present

## 2019-07-11 DIAGNOSIS — E039 Hypothyroidism, unspecified: Secondary | ICD-10-CM | POA: Diagnosis not present

## 2019-09-05 DIAGNOSIS — E039 Hypothyroidism, unspecified: Secondary | ICD-10-CM | POA: Diagnosis not present

## 2019-09-05 DIAGNOSIS — R5383 Other fatigue: Secondary | ICD-10-CM | POA: Diagnosis not present

## 2019-09-05 DIAGNOSIS — N951 Menopausal and female climacteric states: Secondary | ICD-10-CM | POA: Diagnosis not present

## 2019-09-10 DIAGNOSIS — E039 Hypothyroidism, unspecified: Secondary | ICD-10-CM | POA: Diagnosis not present

## 2019-09-10 DIAGNOSIS — G479 Sleep disorder, unspecified: Secondary | ICD-10-CM | POA: Diagnosis not present

## 2019-09-10 DIAGNOSIS — E559 Vitamin D deficiency, unspecified: Secondary | ICD-10-CM | POA: Diagnosis not present

## 2019-09-25 ENCOUNTER — Other Ambulatory Visit: Payer: Self-pay | Admitting: Family Medicine

## 2019-09-25 ENCOUNTER — Other Ambulatory Visit (HOSPITAL_COMMUNITY)
Admission: RE | Admit: 2019-09-25 | Discharge: 2019-09-25 | Disposition: A | Discharge status: Home / Self Care | Payer: BC Managed Care – PPO | Source: Ambulatory Visit | Attending: Family Medicine | Admitting: Family Medicine

## 2019-09-25 DIAGNOSIS — Z01411 Encounter for gynecological examination (general) (routine) with abnormal findings: Secondary | ICD-10-CM | POA: Insufficient documentation

## 2019-09-25 DIAGNOSIS — E1122 Type 2 diabetes mellitus with diabetic chronic kidney disease: Secondary | ICD-10-CM | POA: Diagnosis not present

## 2019-09-25 DIAGNOSIS — Z0000 Encounter for general adult medical examination without abnormal findings: Secondary | ICD-10-CM | POA: Diagnosis not present

## 2019-09-25 DIAGNOSIS — E785 Hyperlipidemia, unspecified: Secondary | ICD-10-CM | POA: Diagnosis not present

## 2019-09-25 DIAGNOSIS — E559 Vitamin D deficiency, unspecified: Secondary | ICD-10-CM | POA: Diagnosis not present

## 2019-09-25 DIAGNOSIS — Z79899 Other long term (current) drug therapy: Secondary | ICD-10-CM | POA: Diagnosis not present

## 2019-09-27 LAB — CYTOLOGY - PAP
Comment: NEGATIVE
Diagnosis: NEGATIVE
Diagnosis: REACTIVE
High risk HPV: NEGATIVE

## 2019-11-06 DIAGNOSIS — C44311 Basal cell carcinoma of skin of nose: Secondary | ICD-10-CM | POA: Diagnosis not present

## 2019-11-06 DIAGNOSIS — D225 Melanocytic nevi of trunk: Secondary | ICD-10-CM | POA: Diagnosis not present

## 2019-11-06 DIAGNOSIS — L821 Other seborrheic keratosis: Secondary | ICD-10-CM | POA: Diagnosis not present

## 2019-11-06 DIAGNOSIS — D1801 Hemangioma of skin and subcutaneous tissue: Secondary | ICD-10-CM | POA: Diagnosis not present

## 2019-11-06 DIAGNOSIS — L820 Inflamed seborrheic keratosis: Secondary | ICD-10-CM | POA: Diagnosis not present

## 2019-11-06 DIAGNOSIS — L304 Erythema intertrigo: Secondary | ICD-10-CM | POA: Diagnosis not present

## 2019-11-06 DIAGNOSIS — D485 Neoplasm of uncertain behavior of skin: Secondary | ICD-10-CM | POA: Diagnosis not present

## 2019-12-10 DIAGNOSIS — E039 Hypothyroidism, unspecified: Secondary | ICD-10-CM | POA: Diagnosis not present

## 2019-12-10 DIAGNOSIS — E559 Vitamin D deficiency, unspecified: Secondary | ICD-10-CM | POA: Diagnosis not present

## 2019-12-12 DIAGNOSIS — E039 Hypothyroidism, unspecified: Secondary | ICD-10-CM | POA: Diagnosis not present

## 2019-12-12 DIAGNOSIS — E559 Vitamin D deficiency, unspecified: Secondary | ICD-10-CM | POA: Diagnosis not present

## 2020-01-03 DIAGNOSIS — C44311 Basal cell carcinoma of skin of nose: Secondary | ICD-10-CM | POA: Diagnosis not present

## 2020-02-05 DIAGNOSIS — L570 Actinic keratosis: Secondary | ICD-10-CM | POA: Diagnosis not present

## 2020-02-05 DIAGNOSIS — L814 Other melanin hyperpigmentation: Secondary | ICD-10-CM | POA: Diagnosis not present

## 2020-02-05 DIAGNOSIS — L820 Inflamed seborrheic keratosis: Secondary | ICD-10-CM | POA: Diagnosis not present

## 2020-02-05 DIAGNOSIS — D485 Neoplasm of uncertain behavior of skin: Secondary | ICD-10-CM | POA: Diagnosis not present

## 2020-02-14 DIAGNOSIS — L570 Actinic keratosis: Secondary | ICD-10-CM | POA: Diagnosis not present

## 2020-02-20 DIAGNOSIS — L570 Actinic keratosis: Secondary | ICD-10-CM | POA: Diagnosis not present

## 2020-03-25 DIAGNOSIS — L578 Other skin changes due to chronic exposure to nonionizing radiation: Secondary | ICD-10-CM | POA: Diagnosis not present

## 2020-03-25 DIAGNOSIS — L700 Acne vulgaris: Secondary | ICD-10-CM | POA: Diagnosis not present

## 2020-03-25 DIAGNOSIS — L814 Other melanin hyperpigmentation: Secondary | ICD-10-CM | POA: Diagnosis not present

## 2020-06-05 DIAGNOSIS — M542 Cervicalgia: Secondary | ICD-10-CM | POA: Diagnosis not present

## 2020-06-05 DIAGNOSIS — M47812 Spondylosis without myelopathy or radiculopathy, cervical region: Secondary | ICD-10-CM | POA: Diagnosis not present

## 2020-06-05 DIAGNOSIS — M9901 Segmental and somatic dysfunction of cervical region: Secondary | ICD-10-CM | POA: Diagnosis not present

## 2020-06-05 DIAGNOSIS — M9902 Segmental and somatic dysfunction of thoracic region: Secondary | ICD-10-CM | POA: Diagnosis not present

## 2020-08-08 DIAGNOSIS — M542 Cervicalgia: Secondary | ICD-10-CM | POA: Diagnosis not present

## 2020-08-08 DIAGNOSIS — M9901 Segmental and somatic dysfunction of cervical region: Secondary | ICD-10-CM | POA: Diagnosis not present

## 2020-08-08 DIAGNOSIS — M47812 Spondylosis without myelopathy or radiculopathy, cervical region: Secondary | ICD-10-CM | POA: Diagnosis not present

## 2020-08-08 DIAGNOSIS — M9902 Segmental and somatic dysfunction of thoracic region: Secondary | ICD-10-CM | POA: Diagnosis not present

## 2020-08-20 DIAGNOSIS — M47812 Spondylosis without myelopathy or radiculopathy, cervical region: Secondary | ICD-10-CM | POA: Diagnosis not present

## 2020-08-20 DIAGNOSIS — M9902 Segmental and somatic dysfunction of thoracic region: Secondary | ICD-10-CM | POA: Diagnosis not present

## 2020-08-20 DIAGNOSIS — M542 Cervicalgia: Secondary | ICD-10-CM | POA: Diagnosis not present

## 2020-08-20 DIAGNOSIS — M9901 Segmental and somatic dysfunction of cervical region: Secondary | ICD-10-CM | POA: Diagnosis not present

## 2020-08-30 ENCOUNTER — Ambulatory Visit: Payer: BC Managed Care – PPO | Attending: Internal Medicine

## 2020-08-30 DIAGNOSIS — Z23 Encounter for immunization: Secondary | ICD-10-CM

## 2020-08-30 NOTE — Progress Notes (Signed)
   Covid-19 Vaccination Clinic  Name:  Jamie Paul    MRN: [id number] DOB: 1961-09-05  08/30/2020  [name] was observed post Covid-19 immunization for 15 minutes without incident. She was provided with Vaccine Information Sheet and instruction to access the V-Safe system.   [name] was instructed to call 911 with any severe reactions post vaccine: Marland Kitchen Difficulty breathing  . Swelling of face and throat  . A fast heartbeat  . A bad rash all over body  . Dizziness and weakness   Immunizations Administered    Name Date Dose VIS Date Route   Pfizer COVID-19 Vaccine 08/30/2020 11:18 AM 0.3 mL 07/16/2020 Intramuscular   Manufacturer: [employer], [employer]   Lot: [id number]   NDC: [address]-7

## 2020-09-09 DIAGNOSIS — M47812 Spondylosis without myelopathy or radiculopathy, cervical region: Secondary | ICD-10-CM | POA: Diagnosis not present

## 2020-09-09 DIAGNOSIS — M9901 Segmental and somatic dysfunction of cervical region: Secondary | ICD-10-CM | POA: Diagnosis not present

## 2020-09-09 DIAGNOSIS — M542 Cervicalgia: Secondary | ICD-10-CM | POA: Diagnosis not present

## 2020-09-09 DIAGNOSIS — M9902 Segmental and somatic dysfunction of thoracic region: Secondary | ICD-10-CM | POA: Diagnosis not present

## 2020-09-23 DIAGNOSIS — M542 Cervicalgia: Secondary | ICD-10-CM | POA: Diagnosis not present

## 2020-09-23 DIAGNOSIS — M47812 Spondylosis without myelopathy or radiculopathy, cervical region: Secondary | ICD-10-CM | POA: Diagnosis not present

## 2020-09-23 DIAGNOSIS — M9902 Segmental and somatic dysfunction of thoracic region: Secondary | ICD-10-CM | POA: Diagnosis not present

## 2020-09-23 DIAGNOSIS — M9901 Segmental and somatic dysfunction of cervical region: Secondary | ICD-10-CM | POA: Diagnosis not present

## 2020-09-25 ENCOUNTER — Other Ambulatory Visit: Payer: Self-pay | Admitting: Family Medicine

## 2020-09-25 DIAGNOSIS — Z1231 Encounter for screening mammogram for malignant neoplasm of breast: Secondary | ICD-10-CM

## 2020-09-30 DIAGNOSIS — Z85828 Personal history of other malignant neoplasm of skin: Secondary | ICD-10-CM | POA: Diagnosis not present

## 2020-09-30 DIAGNOSIS — D1801 Hemangioma of skin and subcutaneous tissue: Secondary | ICD-10-CM | POA: Diagnosis not present

## 2020-09-30 DIAGNOSIS — L905 Scar conditions and fibrosis of skin: Secondary | ICD-10-CM | POA: Diagnosis not present

## 2020-09-30 DIAGNOSIS — L821 Other seborrheic keratosis: Secondary | ICD-10-CM | POA: Diagnosis not present

## 2020-11-05 DIAGNOSIS — Z23 Encounter for immunization: Secondary | ICD-10-CM | POA: Diagnosis not present

## 2020-11-05 DIAGNOSIS — E785 Hyperlipidemia, unspecified: Secondary | ICD-10-CM | POA: Diagnosis not present

## 2020-11-05 DIAGNOSIS — N182 Chronic kidney disease, stage 2 (mild): Secondary | ICD-10-CM | POA: Diagnosis not present

## 2020-11-05 DIAGNOSIS — E559 Vitamin D deficiency, unspecified: Secondary | ICD-10-CM | POA: Diagnosis not present

## 2020-11-05 DIAGNOSIS — E039 Hypothyroidism, unspecified: Secondary | ICD-10-CM | POA: Diagnosis not present

## 2020-11-05 DIAGNOSIS — E1122 Type 2 diabetes mellitus with diabetic chronic kidney disease: Secondary | ICD-10-CM | POA: Diagnosis not present

## 2020-11-05 DIAGNOSIS — Z79899 Other long term (current) drug therapy: Secondary | ICD-10-CM | POA: Diagnosis not present

## 2020-11-05 DIAGNOSIS — Z0000 Encounter for general adult medical examination without abnormal findings: Secondary | ICD-10-CM | POA: Diagnosis not present

## 2020-11-11 ENCOUNTER — Ambulatory Visit
Admission: RE | Admit: 2020-11-11 | Discharge: 2020-11-11 | Disposition: A | Discharge status: Home / Self Care | Payer: BC Managed Care – PPO | Source: Ambulatory Visit | Attending: Family Medicine | Admitting: Family Medicine

## 2020-11-11 ENCOUNTER — Other Ambulatory Visit: Payer: Self-pay

## 2020-11-11 DIAGNOSIS — Z1231 Encounter for screening mammogram for malignant neoplasm of breast: Secondary | ICD-10-CM

## 2020-11-18 ENCOUNTER — Other Ambulatory Visit: Payer: Self-pay | Admitting: Family Medicine

## 2020-11-18 DIAGNOSIS — M9902 Segmental and somatic dysfunction of thoracic region: Secondary | ICD-10-CM | POA: Diagnosis not present

## 2020-11-18 DIAGNOSIS — R928 Other abnormal and inconclusive findings on diagnostic imaging of breast: Secondary | ICD-10-CM

## 2020-11-18 DIAGNOSIS — M47812 Spondylosis without myelopathy or radiculopathy, cervical region: Secondary | ICD-10-CM | POA: Diagnosis not present

## 2020-11-18 DIAGNOSIS — M542 Cervicalgia: Secondary | ICD-10-CM | POA: Diagnosis not present

## 2020-11-18 DIAGNOSIS — M9901 Segmental and somatic dysfunction of cervical region: Secondary | ICD-10-CM | POA: Diagnosis not present

## 2020-11-22 ENCOUNTER — Other Ambulatory Visit: Payer: Self-pay

## 2020-11-22 ENCOUNTER — Encounter (HOSPITAL_COMMUNITY): Payer: Self-pay | Admitting: Emergency Medicine

## 2020-11-22 ENCOUNTER — Emergency Department (HOSPITAL_COMMUNITY)
Admission: EM | Admit: 2020-11-22 | Discharge: 2020-11-22 | Disposition: A | Discharge status: Home / Self Care | Payer: BC Managed Care – PPO | Attending: Emergency Medicine | Admitting: Emergency Medicine

## 2020-11-22 DIAGNOSIS — Z96651 Presence of right artificial knee joint: Secondary | ICD-10-CM | POA: Insufficient documentation

## 2020-11-22 DIAGNOSIS — W260XXA Contact with knife, initial encounter: Secondary | ICD-10-CM | POA: Diagnosis not present

## 2020-11-22 DIAGNOSIS — Y93G1 Activity, food preparation and clean up: Secondary | ICD-10-CM | POA: Insufficient documentation

## 2020-11-22 DIAGNOSIS — Y9289 Other specified places as the place of occurrence of the external cause: Secondary | ICD-10-CM | POA: Diagnosis not present

## 2020-11-22 DIAGNOSIS — S60921A Unspecified superficial injury of right hand, initial encounter: Secondary | ICD-10-CM | POA: Diagnosis not present

## 2020-11-22 DIAGNOSIS — S61411A Laceration without foreign body of right hand, initial encounter: Secondary | ICD-10-CM | POA: Diagnosis not present

## 2020-11-22 DIAGNOSIS — F17210 Nicotine dependence, cigarettes, uncomplicated: Secondary | ICD-10-CM | POA: Diagnosis not present

## 2020-11-22 MED ORDER — LIDOCAINE HCL (PF) 1 % IJ SOLN
5.0000 mL | Freq: Once | INTRAMUSCULAR | Status: AC
  Administered 2020-11-22: 5 mL
  Filled 2020-11-22: qty 30

## 2020-11-22 NOTE — ED Provider Notes (Signed)
[facility]   CSN: [id number] Arrival date & time: 11/22/20  1626     History Chief Complaint  Patient presents with  . Laceration    Jamie Paul is a 60 y.o. female.  Patient states that she was cleaning her dishwasher earlier when she slipped and stumbled over it and her right hand landed on a knife that was pointing upwards.  The tissues were clean.  She states the cut was between her third and fourth MCP joints.  Immediately she ran it under the faucet.  Applied pressure to stop the bleeding but states that it bled "a lot".  Has some tingling in her fingers distally now.  No difficulty with decreased strength or range of motion, no numbness.  Last had tetanus shot in January.  Not taking any anticoagulants.  Is taking Celebrex daily.        Past Medical History:  Diagnosis Date  . Arthritis    knees, ankles, neck, hands  . Dental crowns present    x 2 - right side  . High cholesterol   . Nail deformity 03/2014   left thumb  . Obesity   . PONV (postoperative nausea and vomiting)    likes scopolamine patch  . Seasonal allergies     Patient Active Problem List   Diagnosis Date Noted  . 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 S/P knee replacement 10/20/2018    Past Surgical History:  Procedure Laterality Date  . ANKLE SURGERY Right 07/2012   repair of tendons and ligaments  . ANTERIOR CRUCIATE LIGAMENT REPAIR  08/2001   left  . CHOLECYSTECTOMY  11/2004  . LAPAROSCOPIC INCISIONAL / UMBILICAL / VENTRAL HERNIA REPAIR  04/08/2011   ventral umbilical hernia repair  . MASS EXCISION Right 12/25/2015   Procedure: EXCISION MASS RIGHT THUMB;  Surgeon: Cindee Salt, MD;  Location: [facility];  Service: Orthopedics;  Laterality: Right;  . MENISCUS REPAIR Left 01/1998; 02/1999  . NAILBED REPAIR Left 04/19/2014   Procedure: RECONSTRUCTION LEFT THUMB NAILBED WITH PALMARIS LONGUS GRAFT;   Surgeon: Nicki Reaper, MD;  Location: [facility];  Service: Orthopedics;  Laterality: Left;  . NAILBED REPAIR Right 12/25/2015   Procedure: RECONSTRUCTION NAILBED RIGHT THUMB WITH POLLICIS LONGUS GRAFT;  Surgeon: Cindee Salt, MD;  Location: [facility];  Service: Orthopedics;  Laterality: Right;  . TOTAL KNEE ARTHROPLASTY Left 10/20/2018   Procedure: TOTAL KNEE ARTHROPLASTY;  Surgeon: Eugenia Mcalpine, MD;  Location: [facility];  Service: Orthopedics;  Laterality: Left;  . TRIGGER FINGER RELEASE Right 12/25/2015   Procedure: RELEASE A-1 PULLEY RIGHT THUMB;  Surgeon: Cindee Salt, MD;  Location: [facility];  Service: Orthopedics;  Laterality: Right;     OB History   No obstetric history on file.     Family History  Problem Relation Age of Onset  . Heart attack Father   . Arthritis Mother   . Diabetes Mother   . Hyperlipidemia Brother   . Diabetes Brother   . Diabetes Sister   . Breast cancer Neg Hx     Social History   Tobacco Use  . Smoking status: Current Every Day Smoker    Packs/day: 0.50    Years: 30.00    Pack years: 15.00    Types: Cigarettes  . Smokeless tobacco: Never Used  Vaping Use  . Vaping Use: Never used  Substance Use Topics  . Alcohol use: Yes  Comment: "rarely"  . Drug use: No    Home Medications Prior to Admission medications   Medication Sig Start Date End Date Taking? Authorizing Provider  atorvastatin (LIPITOR) 20 MG tablet Take 20 mg by mouth daily.      [provider]  Cholecalciferol (VITAMIN D-3) 125 MCG (5000 UT) TABS Take 5,000 Units by mouth daily.    [provider]  FLONASE SENSIMIST 27.5 MCG/SPRAY nasal spray Place 2 sprays into the nose daily as needed for allergies.    [provider]  liothyronine (CYTOMEL) 5 MCG tablet Take 10 mcg by mouth daily.    [provider]  loratadine (CLARITIN) 10 MG tablet Take 10 mg by mouth daily.    [provider]   methocarbamol (ROBAXIN) 500 MG tablet Take 1 tablet (500 mg total) by mouth every 8 (eight) hours as needed for muscle spasms. 10/20/18   Stilwell, Bryson L, PA-C  montelukast (SINGULAIR) 10 MG tablet Take 10 mg by mouth daily.     [provider]  Multiple Vitamins-Minerals (MULTIVITAMIN ADULT PO) Take 1 packet by mouth daily. Appetite Support Weight Loss Packet Provided by [employer]    [provider]  nicotine (NICODERM CQ - DOSED IN MG/24 HOURS) 21 mg/24hr patch Place 21 mg onto the skin daily. For 2 weeks then will decrease to 14 mg    [provider]  nicotine polacrilex (NICORETTE) 4 MG gum Take 4 mg by mouth as needed for smoking cessation.    [provider]  phentermine 15 MG capsule Take 15 mg by mouth every morning. Took 1/2 tab    [provider]  UNABLE TO FIND [employer] [facility] Appetite support Takes 2 packets per day    [provider]    Allergies    Patient has no known allergies.  Review of Systems   Review of Systems  All other systems reviewed and are negative.   Physical Exam Updated Vital Signs BP (!) 149/86   Pulse 92   Temp 98.7 F (37.1 C)   Resp 16   SpO2 97%   Physical Exam Constitutional:      General: She is not in acute distress.    Appearance: She is obese.  HENT:     Head: Normocephalic and atraumatic.     Right Ear: Tympanic membrane normal.     Left Ear: Tympanic membrane normal.     Nose: Nose normal.  Musculoskeletal:     Right hand: Laceration and tenderness present. Normal range of motion. Normal strength. Normal sensation.     Left hand: Normal. No tenderness. Normal range of motion. Normal strength. Normal sensation.     Comments: Vertical laceration between the MCP joint of the third and fourth digit on the right hand on the dorsal side.  Approximately 2 cm in length.  Currently not bleeding.  Neurological:     Mental Status: She is alert.     ED Results / Procedures / Treatments    Labs (all labs ordered are listed, but only abnormal results are displayed) Labs Reviewed - No data to display  EKG None  Radiology No results found.  Procedures .Marland KitchenLaceration Repair  Date/Time: 11/22/2020 6:52 PM Performed by: Sandre Kitty, MD Authorized by: Gerhard Munch, MD   Consent:    Consent obtained:  Verbal   Consent given by:  Patient   Risks, benefits, and alternatives were discussed: yes     Risks discussed:  Infection, pain and nerve damage  Alternatives discussed:  No treatment Universal protocol:    Patient identity confirmed:  Verbally with patient Anesthesia:    Anesthesia method:  Local infiltration   Local anesthetic:  Lidocaine 1% w/o epi Laceration details:    Location:  Hand   Hand location:  R hand, dorsum   Length (cm):  2   Depth (mm):  2 Pre-procedure details:    Preparation:  Patient was prepped and draped in usual sterile fashion Exploration:    Hemostasis achieved with:  Direct pressure   Wound exploration: wound explored through full range of motion and entire depth of wound visualized     Wound extent: no nerve damage noted and no tendon damage noted   Treatment:    Area cleansed with:  Saline   Irrigation solution:  Sterile saline   Debridement:  None Skin repair:    Repair method:  Sutures   Suture size:  4-0   Wound skin closure material used: vicryl.   Suture technique:  Simple interrupted   Number of sutures:  3 Approximation:    Approximation:  Close Post-procedure details:    Dressing:  Non-adherent dressing   Procedure completion:  Tolerated well, no immediate complications     Medications Ordered in ED Medications  lidocaine (PF) (XYLOCAINE) 1 % injection 5 mL (5 mLs Infiltration Given 11/22/20 1735)    ED Course  I have reviewed the triage vital signs and the nursing notes.  Pertinent labs & imaging results that were available during my care of the patient were reviewed by me and considered in my medical  decision making (see chart for details).    MDM Rules/Calculators/A&P                          Patient presents to the ED after suffering a right hand laceration 2 hours ago when she landed on a steak knife that had been cleaned in her dishwasher.  She immediately irrigated it herself under the faucet.  Bleeding had stopped by the time she was seen in the ED.  Has some local numbness right around the site of the laceration, but no distal numbness of the fingertips.  Knife appears to have missed any tendons or major vessels.  She has normal range of motion and strength.  Laceration repair was performed with 3 interrupted sutures using Vicryl 4.0.  Patient advised to follow-up with her PCP in the next week for wound recheck.  Red flags discussed. Final Clinical Impression(s) / ED Diagnoses Final diagnoses:  Laceration of right hand without foreign body, initial encounter    Rx / DC Orders ED Discharge Orders    None       Sandre Kitty, MD 11/22/20 1854    Gerhard Munch, MD 11/23/20 2155

## 2020-11-22 NOTE — ED Triage Notes (Signed)
Patient reports cutting right hand with knife emptying dishwasher. Last tetanus 1/22.

## 2020-11-22 NOTE — Discharge Instructions (Signed)
Do not wash for 24 hours.  After that you can wash with soap and water.

## 2020-11-25 DIAGNOSIS — M47812 Spondylosis without myelopathy or radiculopathy, cervical region: Secondary | ICD-10-CM | POA: Diagnosis not present

## 2020-11-25 DIAGNOSIS — M9902 Segmental and somatic dysfunction of thoracic region: Secondary | ICD-10-CM | POA: Diagnosis not present

## 2020-11-25 DIAGNOSIS — M9901 Segmental and somatic dysfunction of cervical region: Secondary | ICD-10-CM | POA: Diagnosis not present

## 2020-11-25 DIAGNOSIS — M542 Cervicalgia: Secondary | ICD-10-CM | POA: Diagnosis not present

## 2020-12-03 ENCOUNTER — Other Ambulatory Visit: Payer: Self-pay | Admitting: Family Medicine

## 2020-12-03 ENCOUNTER — Ambulatory Visit
Admission: RE | Admit: 2020-12-03 | Discharge: 2020-12-03 | Disposition: A | Discharge status: Home / Self Care | Payer: BC Managed Care – PPO | Source: Ambulatory Visit | Attending: Family Medicine | Admitting: Family Medicine

## 2020-12-03 ENCOUNTER — Other Ambulatory Visit: Payer: Self-pay

## 2020-12-03 DIAGNOSIS — N6311 Unspecified lump in the right breast, upper outer quadrant: Secondary | ICD-10-CM | POA: Diagnosis not present

## 2020-12-03 DIAGNOSIS — R928 Other abnormal and inconclusive findings on diagnostic imaging of breast: Secondary | ICD-10-CM

## 2020-12-03 DIAGNOSIS — R922 Inconclusive mammogram: Secondary | ICD-10-CM | POA: Diagnosis not present

## 2020-12-09 DIAGNOSIS — M9902 Segmental and somatic dysfunction of thoracic region: Secondary | ICD-10-CM | POA: Diagnosis not present

## 2020-12-09 DIAGNOSIS — M47812 Spondylosis without myelopathy or radiculopathy, cervical region: Secondary | ICD-10-CM | POA: Diagnosis not present

## 2020-12-09 DIAGNOSIS — M542 Cervicalgia: Secondary | ICD-10-CM | POA: Diagnosis not present

## 2020-12-09 DIAGNOSIS — M9901 Segmental and somatic dysfunction of cervical region: Secondary | ICD-10-CM | POA: Diagnosis not present

## 2020-12-23 DIAGNOSIS — M47812 Spondylosis without myelopathy or radiculopathy, cervical region: Secondary | ICD-10-CM | POA: Diagnosis not present

## 2020-12-23 DIAGNOSIS — M9902 Segmental and somatic dysfunction of thoracic region: Secondary | ICD-10-CM | POA: Diagnosis not present

## 2020-12-23 DIAGNOSIS — M542 Cervicalgia: Secondary | ICD-10-CM | POA: Diagnosis not present

## 2020-12-23 DIAGNOSIS — M9901 Segmental and somatic dysfunction of cervical region: Secondary | ICD-10-CM | POA: Diagnosis not present

## 2021-02-06 DIAGNOSIS — E1122 Type 2 diabetes mellitus with diabetic chronic kidney disease: Secondary | ICD-10-CM | POA: Diagnosis not present

## 2021-02-06 DIAGNOSIS — L659 Nonscarring hair loss, unspecified: Secondary | ICD-10-CM | POA: Diagnosis not present

## 2021-02-06 DIAGNOSIS — Z23 Encounter for immunization: Secondary | ICD-10-CM | POA: Diagnosis not present

## 2021-02-06 DIAGNOSIS — E039 Hypothyroidism, unspecified: Secondary | ICD-10-CM | POA: Diagnosis not present

## 2021-03-09 IMAGING — MG MM DIGITAL DIAGNOSTIC UNILAT*R* W/ TOMO W/ CAD
6 series · 6 of 18 positions shown · non-contrast
Comparison: Previous exam(s).

CLINICAL DATA: Recall from screening mammography, possible mass
involving the central RIGHT breast, most conspicuous on the CC
images.

EXAM:
DIGITAL DIAGNOSTIC UNILATERAL RIGHT MAMMOGRAM WITH TOMOSYNTHESIS AND
CAD; ULTRASOUND RIGHT BREAST LIMITED
TECHNIQUE: Right digital diagnostic mammography and breast tomosynthesis was
performed. The images were evaluated with computer-aided detection.;
Targeted ultrasound examination of the right breast was performed

[R CC synth-2D (1 of 2)]
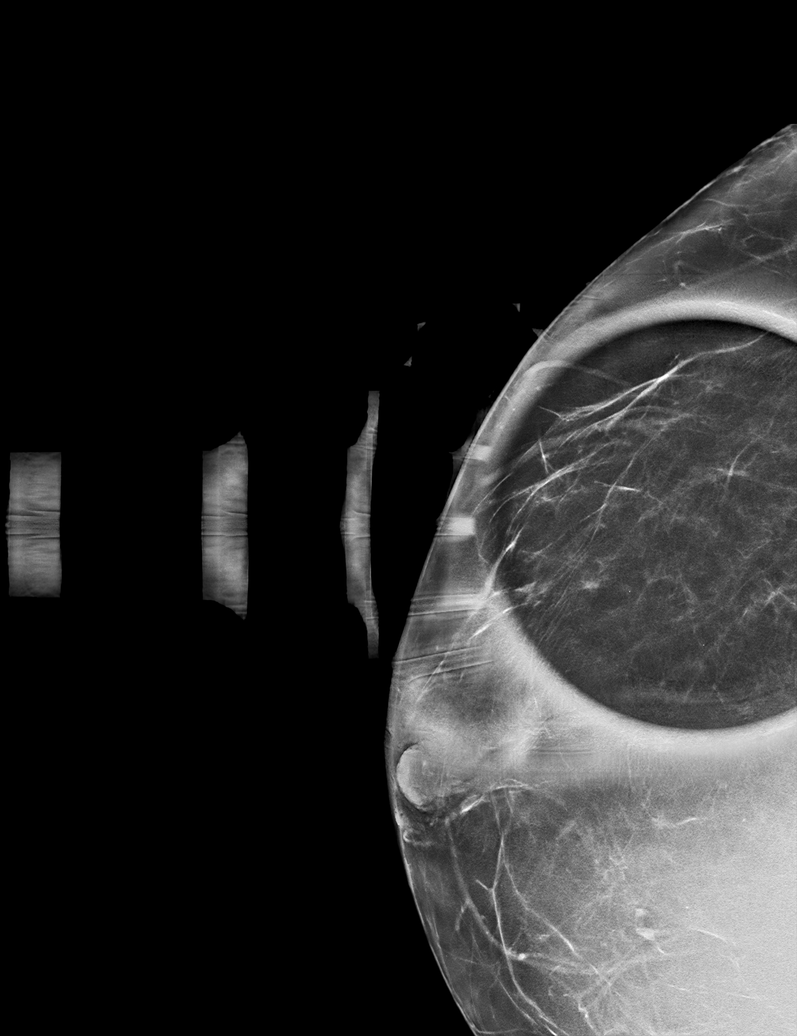

[R ML synth-2D]
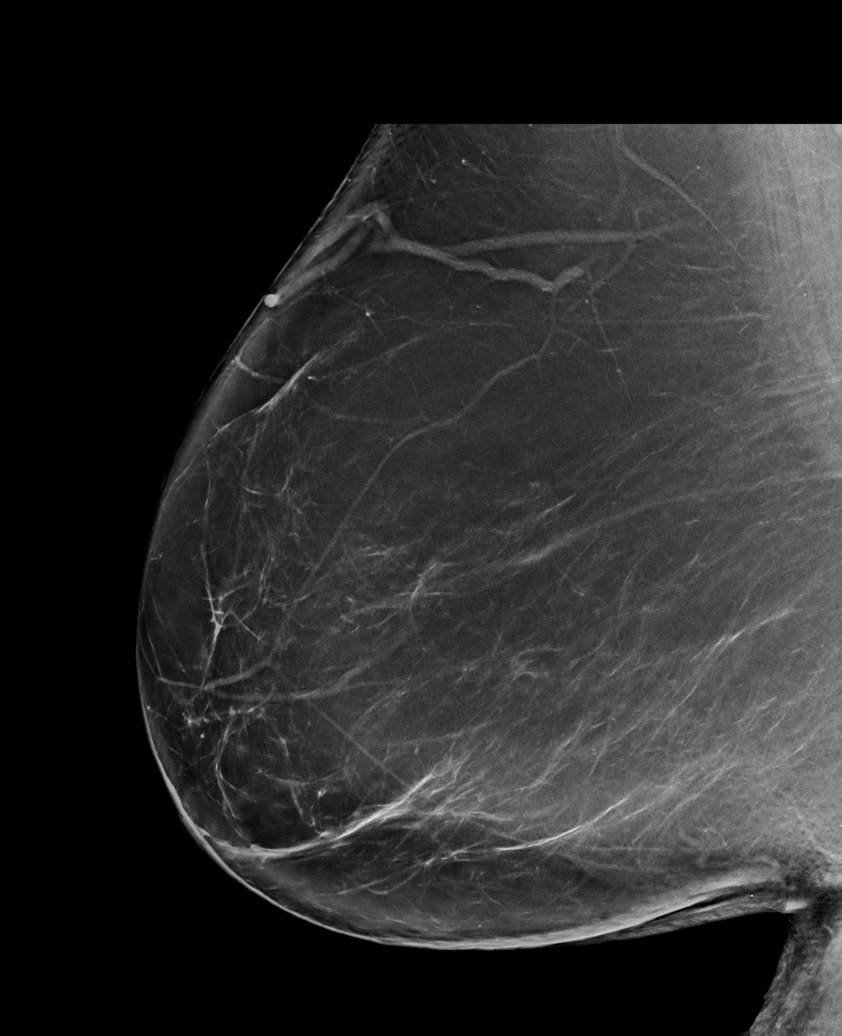

[R CC synth-2D (2 of 2)]
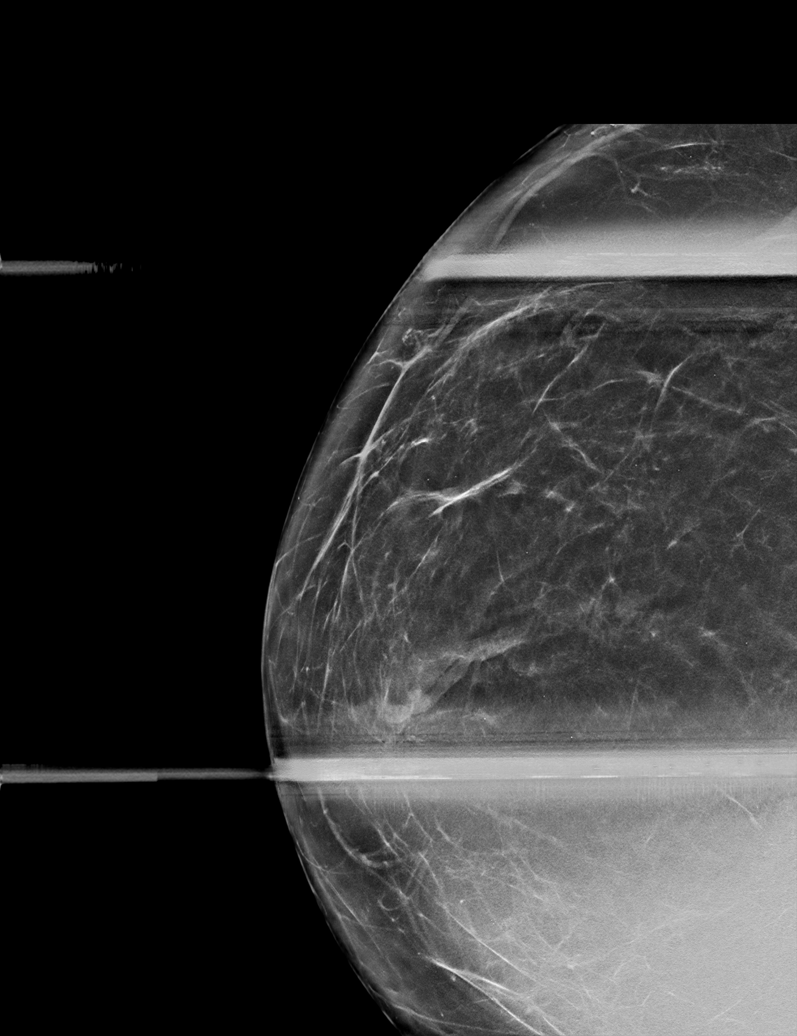

[R CC tomo (1 of 2) · tomo slice 40/79.0]
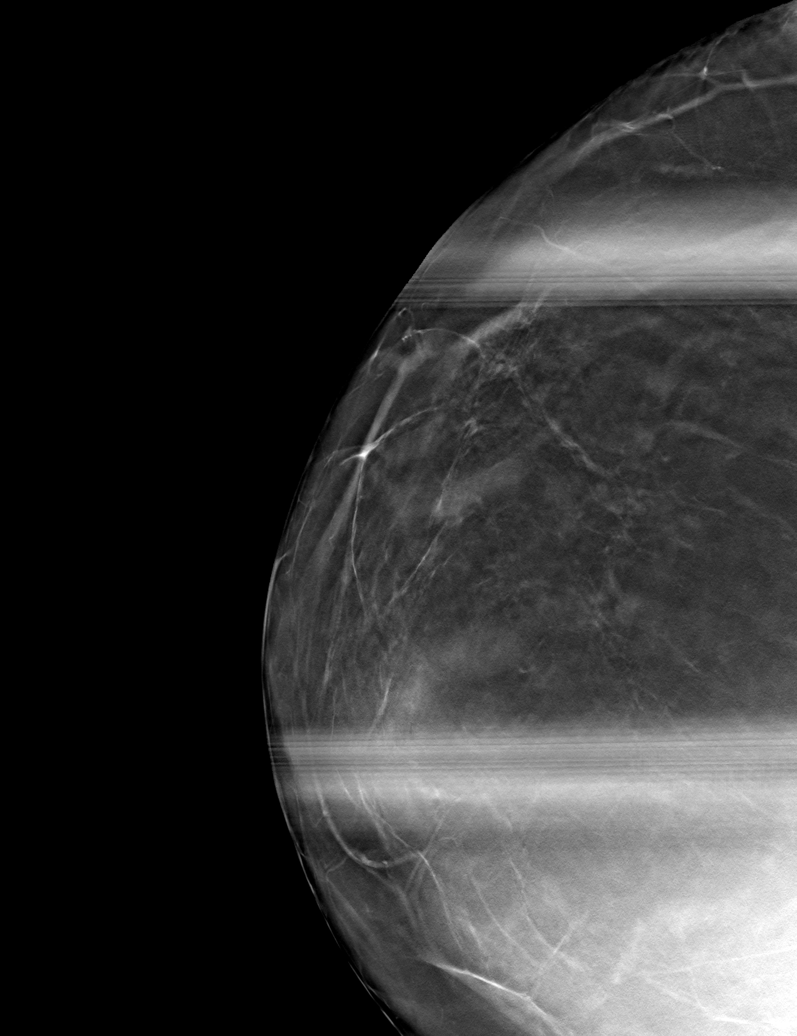

[R ML tomo · tomo slice 53/104.0]
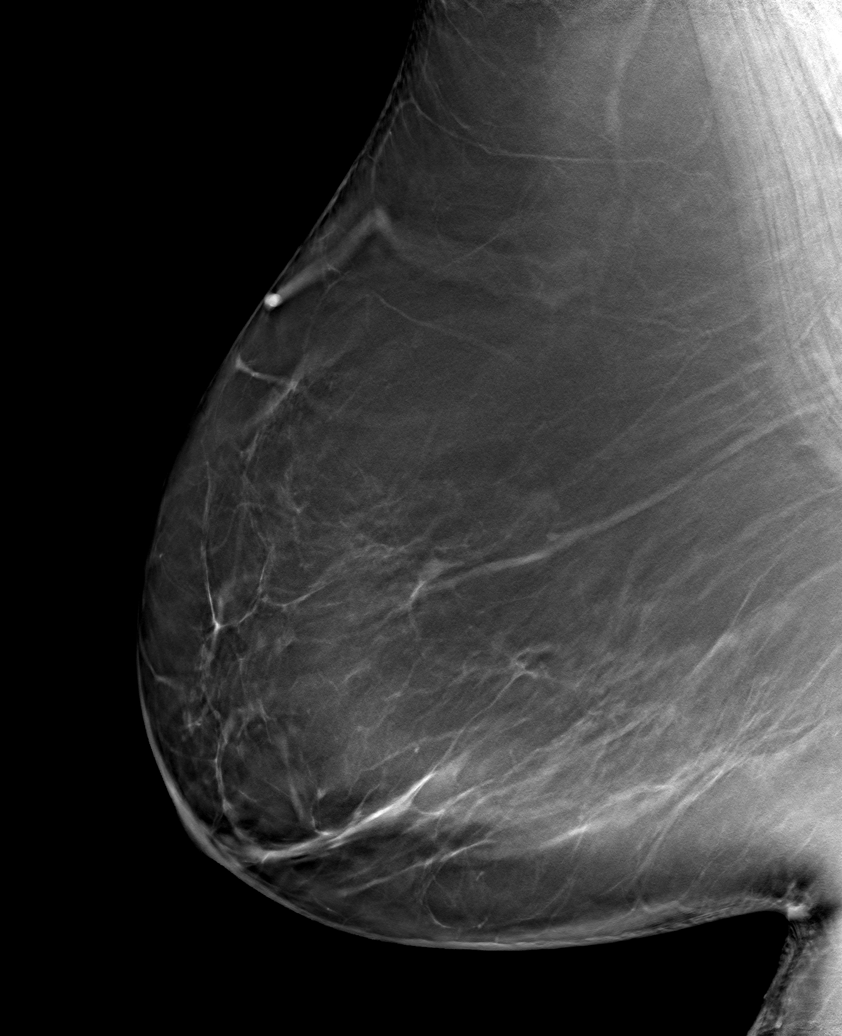

[R CC tomo (2 of 2) · tomo slice 33/65.0]
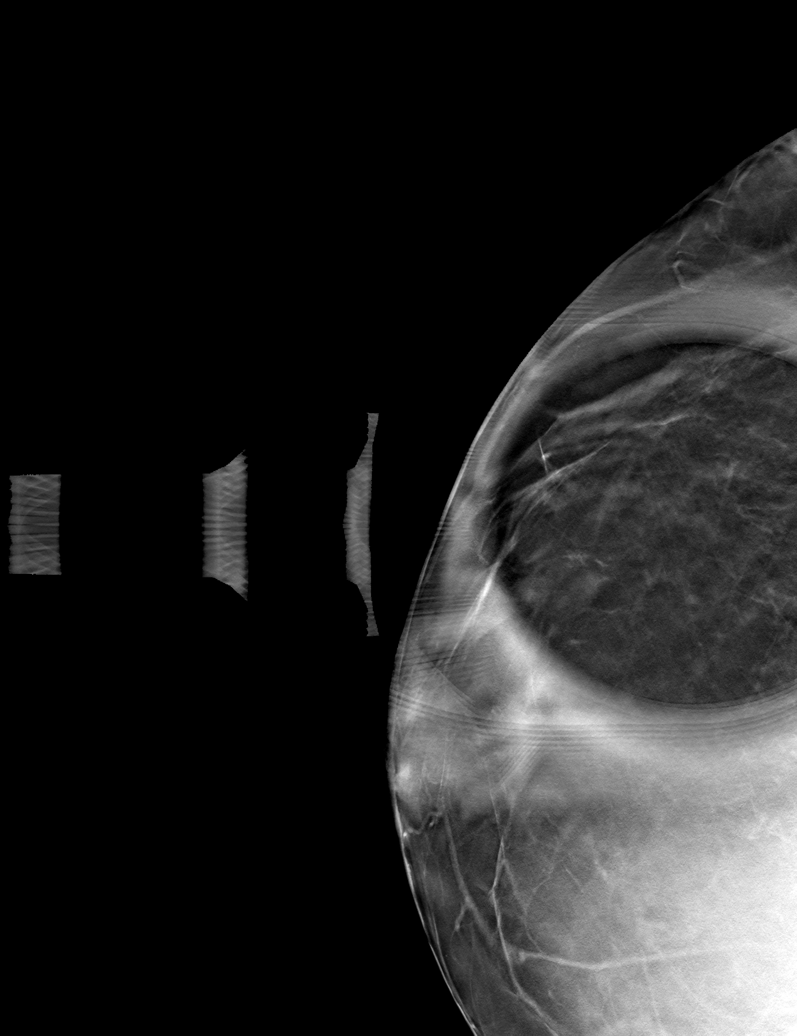

[6 of 18 positions shown; findings below may reference images not displayed]

ACR Breast Density Category b: There are scattered areas of
fibroglandular density.
FINDINGS: Spot-compression CC view and a full field mediolateral view were
obtained. These confirm a circumscribed isodense mass measuring
approximately 6 mm in the central breast, localizing to the slight
UPPER breast on the mediolateral image. There is no associated
architectural distortion or suspicious calcifications.

Targeted ultrasound is performed, demonstrating normal scattered
fibroglandular tissue throughout the UPPER breast. There is no
sonographic correlate for the mammographic mass.
IMPRESSION: Likely benign 6 mm mass in the UPPER central RIGHT breast without
sonographic correlate.

RECOMMENDATION:
Diagnostic RIGHT mammogram in 6 months.

I have discussed the findings and recommendations with the patient.
If applicable, a reminder letter will be sent to the patient
regarding the next appointment.

BI-RADS CATEGORY  3: Probably benign.

## 2021-03-23 DIAGNOSIS — L814 Other melanin hyperpigmentation: Secondary | ICD-10-CM | POA: Diagnosis not present

## 2021-03-23 DIAGNOSIS — L821 Other seborrheic keratosis: Secondary | ICD-10-CM | POA: Diagnosis not present

## 2021-03-23 DIAGNOSIS — L578 Other skin changes due to chronic exposure to nonionizing radiation: Secondary | ICD-10-CM | POA: Diagnosis not present

## 2021-03-23 DIAGNOSIS — L84 Corns and callosities: Secondary | ICD-10-CM | POA: Diagnosis not present

## 2021-06-10 ENCOUNTER — Other Ambulatory Visit: Payer: BC Managed Care – PPO

## 2021-06-17 DIAGNOSIS — Z23 Encounter for immunization: Secondary | ICD-10-CM | POA: Diagnosis not present

## 2021-06-24 ENCOUNTER — Other Ambulatory Visit: Payer: Self-pay

## 2021-10-19 ENCOUNTER — Other Ambulatory Visit (HOSPITAL_COMMUNITY): Payer: Self-pay

## 2021-10-19 MED ORDER — TRULICITY 3 MG/0.5ML ~~LOC~~ SOAJ
3.0000 mg | SUBCUTANEOUS | 5 refills | Status: AC
  Filled 2021-10-19: qty 2, 28d supply, fill #0

## 2021-11-05 ENCOUNTER — Other Ambulatory Visit (HOSPITAL_COMMUNITY): Payer: Self-pay

## 2021-11-05 DIAGNOSIS — E559 Vitamin D deficiency, unspecified: Secondary | ICD-10-CM | POA: Diagnosis not present

## 2021-11-05 DIAGNOSIS — E1122 Type 2 diabetes mellitus with diabetic chronic kidney disease: Secondary | ICD-10-CM | POA: Diagnosis not present

## 2021-11-05 DIAGNOSIS — Z79899 Other long term (current) drug therapy: Secondary | ICD-10-CM | POA: Diagnosis not present

## 2021-11-05 DIAGNOSIS — Z0000 Encounter for general adult medical examination without abnormal findings: Secondary | ICD-10-CM | POA: Diagnosis not present

## 2021-11-05 MED ORDER — TRULICITY 4.5 MG/0.5ML ~~LOC~~ SOAJ
4.5000 mg | SUBCUTANEOUS | 2 refills | Status: AC
  Filled 2021-11-05: qty 2, 28d supply, fill #0
  Filled 2021-12-10: qty 2, 28d supply, fill #1
  Filled 2022-01-07: qty 2, 28d supply, fill #2

## 2021-11-09 ENCOUNTER — Other Ambulatory Visit (HOSPITAL_COMMUNITY): Payer: Self-pay

## 2021-11-10 ENCOUNTER — Other Ambulatory Visit (HOSPITAL_COMMUNITY): Payer: Self-pay

## 2021-12-10 ENCOUNTER — Other Ambulatory Visit (HOSPITAL_COMMUNITY): Payer: Self-pay

## 2022-01-07 ENCOUNTER — Other Ambulatory Visit (HOSPITAL_COMMUNITY): Payer: Self-pay

## 2022-01-11 ENCOUNTER — Other Ambulatory Visit (HOSPITAL_COMMUNITY): Payer: Self-pay

## 2022-01-11 MED ORDER — MOUNJARO 7.5 MG/0.5ML ~~LOC~~ SOPN
7.5000 mg | PEN_INJECTOR | SUBCUTANEOUS | 0 refills | Status: AC
Start: 2022-01-11 — End: ?
  Filled 2022-01-11 – 2022-07-05 (×2): qty 2, 28d supply, fill #0

## 2022-01-14 ENCOUNTER — Other Ambulatory Visit (HOSPITAL_COMMUNITY): Payer: Self-pay

## 2022-01-14 MED ORDER — MOUNJARO 10 MG/0.5ML ~~LOC~~ SOAJ
10.0000 mg | SUBCUTANEOUS | 1 refills | Status: DC
Start: 2022-01-14 — End: 2022-05-07
  Filled 2022-01-14: qty 4, 56d supply, fill #0
  Filled 2022-03-10: qty 4, 56d supply, fill #1

## 2022-03-10 ENCOUNTER — Other Ambulatory Visit (HOSPITAL_COMMUNITY): Payer: Self-pay

## 2022-04-22 DIAGNOSIS — M9902 Segmental and somatic dysfunction of thoracic region: Secondary | ICD-10-CM | POA: Diagnosis not present

## 2022-04-22 DIAGNOSIS — M47812 Spondylosis without myelopathy or radiculopathy, cervical region: Secondary | ICD-10-CM | POA: Diagnosis not present

## 2022-04-22 DIAGNOSIS — M9901 Segmental and somatic dysfunction of cervical region: Secondary | ICD-10-CM | POA: Diagnosis not present

## 2022-04-22 DIAGNOSIS — M542 Cervicalgia: Secondary | ICD-10-CM | POA: Diagnosis not present

## 2022-04-29 DIAGNOSIS — M542 Cervicalgia: Secondary | ICD-10-CM | POA: Diagnosis not present

## 2022-04-29 DIAGNOSIS — M9901 Segmental and somatic dysfunction of cervical region: Secondary | ICD-10-CM | POA: Diagnosis not present

## 2022-04-29 DIAGNOSIS — M47812 Spondylosis without myelopathy or radiculopathy, cervical region: Secondary | ICD-10-CM | POA: Diagnosis not present

## 2022-04-29 DIAGNOSIS — M9902 Segmental and somatic dysfunction of thoracic region: Secondary | ICD-10-CM | POA: Diagnosis not present

## 2022-05-06 ENCOUNTER — Other Ambulatory Visit (HOSPITAL_COMMUNITY): Payer: Self-pay

## 2022-05-06 DIAGNOSIS — M9902 Segmental and somatic dysfunction of thoracic region: Secondary | ICD-10-CM | POA: Diagnosis not present

## 2022-05-06 DIAGNOSIS — M9901 Segmental and somatic dysfunction of cervical region: Secondary | ICD-10-CM | POA: Diagnosis not present

## 2022-05-06 DIAGNOSIS — M47812 Spondylosis without myelopathy or radiculopathy, cervical region: Secondary | ICD-10-CM | POA: Diagnosis not present

## 2022-05-06 DIAGNOSIS — M542 Cervicalgia: Secondary | ICD-10-CM | POA: Diagnosis not present

## 2022-05-07 ENCOUNTER — Other Ambulatory Visit (HOSPITAL_COMMUNITY): Payer: Self-pay

## 2022-05-07 MED ORDER — MOUNJARO 10 MG/0.5ML ~~LOC~~ SOAJ
10.0000 mg | SUBCUTANEOUS | 5 refills | Status: AC
  Filled 2022-05-07: qty 2, 28d supply, fill #0
  Filled 2022-06-02: qty 2, 28d supply, fill #1
  Filled 2022-07-01: qty 2, 28d supply, fill #2

## 2022-05-10 ENCOUNTER — Other Ambulatory Visit (HOSPITAL_COMMUNITY): Payer: Self-pay

## 2022-05-13 DIAGNOSIS — M542 Cervicalgia: Secondary | ICD-10-CM | POA: Diagnosis not present

## 2022-05-13 DIAGNOSIS — M47812 Spondylosis without myelopathy or radiculopathy, cervical region: Secondary | ICD-10-CM | POA: Diagnosis not present

## 2022-05-13 DIAGNOSIS — M9901 Segmental and somatic dysfunction of cervical region: Secondary | ICD-10-CM | POA: Diagnosis not present

## 2022-05-13 DIAGNOSIS — M9902 Segmental and somatic dysfunction of thoracic region: Secondary | ICD-10-CM | POA: Diagnosis not present

## 2022-05-20 DIAGNOSIS — M542 Cervicalgia: Secondary | ICD-10-CM | POA: Diagnosis not present

## 2022-05-20 DIAGNOSIS — M9902 Segmental and somatic dysfunction of thoracic region: Secondary | ICD-10-CM | POA: Diagnosis not present

## 2022-05-20 DIAGNOSIS — M9901 Segmental and somatic dysfunction of cervical region: Secondary | ICD-10-CM | POA: Diagnosis not present

## 2022-05-20 DIAGNOSIS — M47812 Spondylosis without myelopathy or radiculopathy, cervical region: Secondary | ICD-10-CM | POA: Diagnosis not present

## 2022-06-03 ENCOUNTER — Other Ambulatory Visit (HOSPITAL_COMMUNITY): Payer: Self-pay

## 2022-06-03 DIAGNOSIS — M9901 Segmental and somatic dysfunction of cervical region: Secondary | ICD-10-CM | POA: Diagnosis not present

## 2022-06-03 DIAGNOSIS — M9902 Segmental and somatic dysfunction of thoracic region: Secondary | ICD-10-CM | POA: Diagnosis not present

## 2022-06-03 DIAGNOSIS — M47812 Spondylosis without myelopathy or radiculopathy, cervical region: Secondary | ICD-10-CM | POA: Diagnosis not present

## 2022-06-03 DIAGNOSIS — M542 Cervicalgia: Secondary | ICD-10-CM | POA: Diagnosis not present

## 2022-06-17 DIAGNOSIS — M542 Cervicalgia: Secondary | ICD-10-CM | POA: Diagnosis not present

## 2022-06-17 DIAGNOSIS — M9901 Segmental and somatic dysfunction of cervical region: Secondary | ICD-10-CM | POA: Diagnosis not present

## 2022-06-17 DIAGNOSIS — M9902 Segmental and somatic dysfunction of thoracic region: Secondary | ICD-10-CM | POA: Diagnosis not present

## 2022-06-17 DIAGNOSIS — M47812 Spondylosis without myelopathy or radiculopathy, cervical region: Secondary | ICD-10-CM | POA: Diagnosis not present

## 2022-07-01 DIAGNOSIS — M542 Cervicalgia: Secondary | ICD-10-CM | POA: Diagnosis not present

## 2022-07-01 DIAGNOSIS — M47812 Spondylosis without myelopathy or radiculopathy, cervical region: Secondary | ICD-10-CM | POA: Diagnosis not present

## 2022-07-01 DIAGNOSIS — M9901 Segmental and somatic dysfunction of cervical region: Secondary | ICD-10-CM | POA: Diagnosis not present

## 2022-07-01 DIAGNOSIS — M9902 Segmental and somatic dysfunction of thoracic region: Secondary | ICD-10-CM | POA: Diagnosis not present

## 2022-07-02 ENCOUNTER — Other Ambulatory Visit (HOSPITAL_COMMUNITY): Payer: Self-pay

## 2022-07-05 ENCOUNTER — Other Ambulatory Visit (HOSPITAL_COMMUNITY): Payer: Self-pay

## 2022-07-07 ENCOUNTER — Other Ambulatory Visit (HOSPITAL_COMMUNITY): Payer: Self-pay

## 2022-07-07 MED ORDER — MOUNJARO 12.5 MG/0.5ML ~~LOC~~ SOAJ
12.5000 mg | SUBCUTANEOUS | 2 refills | Status: DC
  Filled 2022-07-07: qty 2, 28d supply, fill #0
  Filled 2022-08-04: qty 2, 28d supply, fill #1
  Filled 2022-09-01: qty 2, 28d supply, fill #2

## 2022-07-09 ENCOUNTER — Other Ambulatory Visit (HOSPITAL_COMMUNITY): Payer: Self-pay

## 2022-07-15 DIAGNOSIS — M47812 Spondylosis without myelopathy or radiculopathy, cervical region: Secondary | ICD-10-CM | POA: Diagnosis not present

## 2022-07-15 DIAGNOSIS — M9901 Segmental and somatic dysfunction of cervical region: Secondary | ICD-10-CM | POA: Diagnosis not present

## 2022-07-15 DIAGNOSIS — M9902 Segmental and somatic dysfunction of thoracic region: Secondary | ICD-10-CM | POA: Diagnosis not present

## 2022-07-15 DIAGNOSIS — M542 Cervicalgia: Secondary | ICD-10-CM | POA: Diagnosis not present

## 2022-07-29 DIAGNOSIS — M9901 Segmental and somatic dysfunction of cervical region: Secondary | ICD-10-CM | POA: Diagnosis not present

## 2022-07-29 DIAGNOSIS — M9902 Segmental and somatic dysfunction of thoracic region: Secondary | ICD-10-CM | POA: Diagnosis not present

## 2022-07-29 DIAGNOSIS — M542 Cervicalgia: Secondary | ICD-10-CM | POA: Diagnosis not present

## 2022-07-29 DIAGNOSIS — M47812 Spondylosis without myelopathy or radiculopathy, cervical region: Secondary | ICD-10-CM | POA: Diagnosis not present

## 2022-08-04 ENCOUNTER — Other Ambulatory Visit (HOSPITAL_COMMUNITY): Payer: Self-pay

## 2022-08-12 DIAGNOSIS — M9901 Segmental and somatic dysfunction of cervical region: Secondary | ICD-10-CM | POA: Diagnosis not present

## 2022-08-12 DIAGNOSIS — M542 Cervicalgia: Secondary | ICD-10-CM | POA: Diagnosis not present

## 2022-08-12 DIAGNOSIS — M47812 Spondylosis without myelopathy or radiculopathy, cervical region: Secondary | ICD-10-CM | POA: Diagnosis not present

## 2022-08-12 DIAGNOSIS — M9902 Segmental and somatic dysfunction of thoracic region: Secondary | ICD-10-CM | POA: Diagnosis not present

## 2022-08-26 DIAGNOSIS — M9902 Segmental and somatic dysfunction of thoracic region: Secondary | ICD-10-CM | POA: Diagnosis not present

## 2022-08-26 DIAGNOSIS — M47812 Spondylosis without myelopathy or radiculopathy, cervical region: Secondary | ICD-10-CM | POA: Diagnosis not present

## 2022-08-26 DIAGNOSIS — M542 Cervicalgia: Secondary | ICD-10-CM | POA: Diagnosis not present

## 2022-08-26 DIAGNOSIS — M9901 Segmental and somatic dysfunction of cervical region: Secondary | ICD-10-CM | POA: Diagnosis not present

## 2022-09-09 DIAGNOSIS — M9902 Segmental and somatic dysfunction of thoracic region: Secondary | ICD-10-CM | POA: Diagnosis not present

## 2022-09-09 DIAGNOSIS — M47812 Spondylosis without myelopathy or radiculopathy, cervical region: Secondary | ICD-10-CM | POA: Diagnosis not present

## 2022-09-09 DIAGNOSIS — M9901 Segmental and somatic dysfunction of cervical region: Secondary | ICD-10-CM | POA: Diagnosis not present

## 2022-09-09 DIAGNOSIS — M542 Cervicalgia: Secondary | ICD-10-CM | POA: Diagnosis not present

## 2022-09-23 DIAGNOSIS — M9902 Segmental and somatic dysfunction of thoracic region: Secondary | ICD-10-CM | POA: Diagnosis not present

## 2022-09-23 DIAGNOSIS — M9901 Segmental and somatic dysfunction of cervical region: Secondary | ICD-10-CM | POA: Diagnosis not present

## 2022-09-23 DIAGNOSIS — M542 Cervicalgia: Secondary | ICD-10-CM | POA: Diagnosis not present

## 2022-09-23 DIAGNOSIS — M47812 Spondylosis without myelopathy or radiculopathy, cervical region: Secondary | ICD-10-CM | POA: Diagnosis not present

## 2022-09-27 ENCOUNTER — Other Ambulatory Visit (HOSPITAL_COMMUNITY): Payer: Self-pay

## 2022-09-29 ENCOUNTER — Other Ambulatory Visit (HOSPITAL_COMMUNITY): Payer: Self-pay

## 2022-09-29 MED ORDER — MOUNJARO 12.5 MG/0.5ML ~~LOC~~ SOAJ
12.5000 mg | SUBCUTANEOUS | 1 refills | Status: DC
  Filled 2022-09-29: qty 2, 28d supply, fill #0
  Filled 2022-10-27: qty 2, 28d supply, fill #1

## 2022-09-30 ENCOUNTER — Other Ambulatory Visit (HOSPITAL_COMMUNITY): Payer: Self-pay

## 2022-10-07 DIAGNOSIS — M542 Cervicalgia: Secondary | ICD-10-CM | POA: Diagnosis not present

## 2022-10-07 DIAGNOSIS — M9902 Segmental and somatic dysfunction of thoracic region: Secondary | ICD-10-CM | POA: Diagnosis not present

## 2022-10-07 DIAGNOSIS — M9901 Segmental and somatic dysfunction of cervical region: Secondary | ICD-10-CM | POA: Diagnosis not present

## 2022-10-07 DIAGNOSIS — M47812 Spondylosis without myelopathy or radiculopathy, cervical region: Secondary | ICD-10-CM | POA: Diagnosis not present

## 2022-10-21 DIAGNOSIS — M47812 Spondylosis without myelopathy or radiculopathy, cervical region: Secondary | ICD-10-CM | POA: Diagnosis not present

## 2022-10-21 DIAGNOSIS — M9902 Segmental and somatic dysfunction of thoracic region: Secondary | ICD-10-CM | POA: Diagnosis not present

## 2022-10-21 DIAGNOSIS — M9901 Segmental and somatic dysfunction of cervical region: Secondary | ICD-10-CM | POA: Diagnosis not present

## 2022-10-21 DIAGNOSIS — M542 Cervicalgia: Secondary | ICD-10-CM | POA: Diagnosis not present

## 2022-11-04 DIAGNOSIS — M47812 Spondylosis without myelopathy or radiculopathy, cervical region: Secondary | ICD-10-CM | POA: Diagnosis not present

## 2022-11-04 DIAGNOSIS — M9901 Segmental and somatic dysfunction of cervical region: Secondary | ICD-10-CM | POA: Diagnosis not present

## 2022-11-04 DIAGNOSIS — M9902 Segmental and somatic dysfunction of thoracic region: Secondary | ICD-10-CM | POA: Diagnosis not present

## 2022-11-04 DIAGNOSIS — M542 Cervicalgia: Secondary | ICD-10-CM | POA: Diagnosis not present

## 2022-11-18 ENCOUNTER — Other Ambulatory Visit: Payer: Self-pay | Admitting: Family Medicine

## 2022-11-18 ENCOUNTER — Other Ambulatory Visit (HOSPITAL_COMMUNITY): Payer: Self-pay

## 2022-11-18 ENCOUNTER — Other Ambulatory Visit (HOSPITAL_COMMUNITY)
Admission: RE | Admit: 2022-11-18 | Discharge: 2022-11-18 | Disposition: A | Discharge status: Home / Self Care | Payer: BC Managed Care – PPO | Source: Ambulatory Visit | Attending: Family Medicine | Admitting: Family Medicine

## 2022-11-18 DIAGNOSIS — E1122 Type 2 diabetes mellitus with diabetic chronic kidney disease: Secondary | ICD-10-CM | POA: Diagnosis not present

## 2022-11-18 DIAGNOSIS — Z79899 Other long term (current) drug therapy: Secondary | ICD-10-CM | POA: Diagnosis not present

## 2022-11-18 DIAGNOSIS — Z01411 Encounter for gynecological examination (general) (routine) with abnormal findings: Secondary | ICD-10-CM | POA: Insufficient documentation

## 2022-11-18 DIAGNOSIS — M47812 Spondylosis without myelopathy or radiculopathy, cervical region: Secondary | ICD-10-CM | POA: Diagnosis not present

## 2022-11-18 DIAGNOSIS — M542 Cervicalgia: Secondary | ICD-10-CM | POA: Diagnosis not present

## 2022-11-18 DIAGNOSIS — E039 Hypothyroidism, unspecified: Secondary | ICD-10-CM | POA: Diagnosis not present

## 2022-11-18 DIAGNOSIS — Z0000 Encounter for general adult medical examination without abnormal findings: Secondary | ICD-10-CM | POA: Diagnosis not present

## 2022-11-18 DIAGNOSIS — E785 Hyperlipidemia, unspecified: Secondary | ICD-10-CM | POA: Diagnosis not present

## 2022-11-18 DIAGNOSIS — M9902 Segmental and somatic dysfunction of thoracic region: Secondary | ICD-10-CM | POA: Diagnosis not present

## 2022-11-18 DIAGNOSIS — F17210 Nicotine dependence, cigarettes, uncomplicated: Secondary | ICD-10-CM | POA: Diagnosis not present

## 2022-11-18 DIAGNOSIS — M9901 Segmental and somatic dysfunction of cervical region: Secondary | ICD-10-CM | POA: Diagnosis not present

## 2022-11-18 DIAGNOSIS — E559 Vitamin D deficiency, unspecified: Secondary | ICD-10-CM | POA: Diagnosis not present

## 2022-11-18 MED ORDER — MOUNJARO 12.5 MG/0.5ML ~~LOC~~ SOAJ
12.5000 mg | SUBCUTANEOUS | 0 refills | Status: DC
  Filled 2022-11-18: qty 2, 28d supply, fill #0
  Filled 2022-12-22: qty 2, 28d supply, fill #1
  Filled 2023-02-01: qty 2, 28d supply, fill #2

## 2022-11-19 DIAGNOSIS — Z1211 Encounter for screening for malignant neoplasm of colon: Secondary | ICD-10-CM | POA: Diagnosis not present

## 2022-11-22 LAB — CYTOLOGY - PAP
Comment: NEGATIVE
Diagnosis: NEGATIVE
High risk HPV: NEGATIVE

## 2022-11-23 ENCOUNTER — Other Ambulatory Visit: Payer: Self-pay

## 2022-12-02 DIAGNOSIS — M9902 Segmental and somatic dysfunction of thoracic region: Secondary | ICD-10-CM | POA: Diagnosis not present

## 2022-12-02 DIAGNOSIS — M9901 Segmental and somatic dysfunction of cervical region: Secondary | ICD-10-CM | POA: Diagnosis not present

## 2022-12-02 DIAGNOSIS — M542 Cervicalgia: Secondary | ICD-10-CM | POA: Diagnosis not present

## 2022-12-02 DIAGNOSIS — M47812 Spondylosis without myelopathy or radiculopathy, cervical region: Secondary | ICD-10-CM | POA: Diagnosis not present

## 2022-12-16 DIAGNOSIS — M9901 Segmental and somatic dysfunction of cervical region: Secondary | ICD-10-CM | POA: Diagnosis not present

## 2022-12-16 DIAGNOSIS — M47812 Spondylosis without myelopathy or radiculopathy, cervical region: Secondary | ICD-10-CM | POA: Diagnosis not present

## 2022-12-16 DIAGNOSIS — M542 Cervicalgia: Secondary | ICD-10-CM | POA: Diagnosis not present

## 2022-12-16 DIAGNOSIS — M9902 Segmental and somatic dysfunction of thoracic region: Secondary | ICD-10-CM | POA: Diagnosis not present

## 2022-12-24 ENCOUNTER — Other Ambulatory Visit (HOSPITAL_COMMUNITY): Payer: Self-pay

## 2022-12-27 ENCOUNTER — Other Ambulatory Visit (HOSPITAL_COMMUNITY): Payer: Self-pay

## 2022-12-27 MED ORDER — MOUNJARO 10 MG/0.5ML ~~LOC~~ SOAJ
10.0000 mg | SUBCUTANEOUS | 0 refills | Status: AC
  Filled 2022-12-27: qty 2, 28d supply, fill #0

## 2022-12-30 DIAGNOSIS — M9901 Segmental and somatic dysfunction of cervical region: Secondary | ICD-10-CM | POA: Diagnosis not present

## 2022-12-30 DIAGNOSIS — M9902 Segmental and somatic dysfunction of thoracic region: Secondary | ICD-10-CM | POA: Diagnosis not present

## 2022-12-30 DIAGNOSIS — M47812 Spondylosis without myelopathy or radiculopathy, cervical region: Secondary | ICD-10-CM | POA: Diagnosis not present

## 2022-12-30 DIAGNOSIS — M542 Cervicalgia: Secondary | ICD-10-CM | POA: Diagnosis not present

## 2023-01-03 ENCOUNTER — Other Ambulatory Visit (HOSPITAL_COMMUNITY): Payer: Self-pay

## 2023-01-13 DIAGNOSIS — M9902 Segmental and somatic dysfunction of thoracic region: Secondary | ICD-10-CM | POA: Diagnosis not present

## 2023-01-13 DIAGNOSIS — M542 Cervicalgia: Secondary | ICD-10-CM | POA: Diagnosis not present

## 2023-01-13 DIAGNOSIS — M47812 Spondylosis without myelopathy or radiculopathy, cervical region: Secondary | ICD-10-CM | POA: Diagnosis not present

## 2023-01-13 DIAGNOSIS — M9901 Segmental and somatic dysfunction of cervical region: Secondary | ICD-10-CM | POA: Diagnosis not present

## 2023-01-27 DIAGNOSIS — M9901 Segmental and somatic dysfunction of cervical region: Secondary | ICD-10-CM | POA: Diagnosis not present

## 2023-01-27 DIAGNOSIS — M9902 Segmental and somatic dysfunction of thoracic region: Secondary | ICD-10-CM | POA: Diagnosis not present

## 2023-01-27 DIAGNOSIS — M542 Cervicalgia: Secondary | ICD-10-CM | POA: Diagnosis not present

## 2023-01-27 DIAGNOSIS — M47812 Spondylosis without myelopathy or radiculopathy, cervical region: Secondary | ICD-10-CM | POA: Diagnosis not present

## 2023-02-10 DIAGNOSIS — M542 Cervicalgia: Secondary | ICD-10-CM | POA: Diagnosis not present

## 2023-02-10 DIAGNOSIS — M47812 Spondylosis without myelopathy or radiculopathy, cervical region: Secondary | ICD-10-CM | POA: Diagnosis not present

## 2023-02-10 DIAGNOSIS — M9902 Segmental and somatic dysfunction of thoracic region: Secondary | ICD-10-CM | POA: Diagnosis not present

## 2023-02-10 DIAGNOSIS — M9901 Segmental and somatic dysfunction of cervical region: Secondary | ICD-10-CM | POA: Diagnosis not present

## 2023-03-01 ENCOUNTER — Other Ambulatory Visit (HOSPITAL_COMMUNITY): Payer: Self-pay

## 2023-03-01 MED ORDER — MOUNJARO 12.5 MG/0.5ML ~~LOC~~ SOAJ
12.5000 mg | SUBCUTANEOUS | 0 refills | Status: AC
  Filled 2023-03-01: qty 6, 84d supply, fill #0

## 2023-03-15 DIAGNOSIS — M9901 Segmental and somatic dysfunction of cervical region: Secondary | ICD-10-CM | POA: Diagnosis not present

## 2023-03-15 DIAGNOSIS — M47812 Spondylosis without myelopathy or radiculopathy, cervical region: Secondary | ICD-10-CM | POA: Diagnosis not present

## 2023-03-15 DIAGNOSIS — M542 Cervicalgia: Secondary | ICD-10-CM | POA: Diagnosis not present

## 2023-03-15 DIAGNOSIS — M9902 Segmental and somatic dysfunction of thoracic region: Secondary | ICD-10-CM | POA: Diagnosis not present

## 2023-04-01 DIAGNOSIS — M9902 Segmental and somatic dysfunction of thoracic region: Secondary | ICD-10-CM | POA: Diagnosis not present

## 2023-04-01 DIAGNOSIS — M542 Cervicalgia: Secondary | ICD-10-CM | POA: Diagnosis not present

## 2023-04-01 DIAGNOSIS — M9901 Segmental and somatic dysfunction of cervical region: Secondary | ICD-10-CM | POA: Diagnosis not present

## 2023-04-01 DIAGNOSIS — M47812 Spondylosis without myelopathy or radiculopathy, cervical region: Secondary | ICD-10-CM | POA: Diagnosis not present

## 2023-04-21 DIAGNOSIS — M9902 Segmental and somatic dysfunction of thoracic region: Secondary | ICD-10-CM | POA: Diagnosis not present

## 2023-04-21 DIAGNOSIS — M47812 Spondylosis without myelopathy or radiculopathy, cervical region: Secondary | ICD-10-CM | POA: Diagnosis not present

## 2023-04-21 DIAGNOSIS — M9901 Segmental and somatic dysfunction of cervical region: Secondary | ICD-10-CM | POA: Diagnosis not present

## 2023-04-21 DIAGNOSIS — M542 Cervicalgia: Secondary | ICD-10-CM | POA: Diagnosis not present

## 2023-04-27 ENCOUNTER — Encounter: Payer: Self-pay | Admitting: Adult Health

## 2023-04-27 ENCOUNTER — Ambulatory Visit (INDEPENDENT_AMBULATORY_CARE_PROVIDER_SITE_OTHER): Payer: BC Managed Care – PPO | Admitting: Adult Health

## 2023-04-27 VITALS — BP 128/84 | HR 78 | Ht 67.0 in | Wt 222.0 lb

## 2023-04-27 DIAGNOSIS — F902 Attention-deficit hyperactivity disorder, combined type: Secondary | ICD-10-CM | POA: Diagnosis not present

## 2023-04-27 MED ORDER — LISDEXAMFETAMINE DIMESYLATE 20 MG PO CAPS: 20.0000 mg | ORAL_CAPSULE | Freq: Every day | ORAL | 0 refills | Status: DC

## 2023-04-27 NOTE — Progress Notes (Signed)
[facility] MD/PA/NP Initial Note  04/27/2023 8:30 AM Jamie Paul  MRN:  [id number]  Chief Complaint:   HPI:   Patient seen today for initial psychiatric evaluation.   Describes mood today as "ok". Pleasant. Denies tearfulness. Mood symptoms - denies depression - "has had bouts over the years". Reports feeling anxious a good portion of the day. Denies irritability - quick to internalize things, but can walk away. Denies panic attacks - "history of". Reports worry, rumination, and over thinking. Mood is stable. Stating "I feel like I'm doing alight". Reports she is concerned about her attention issues. Reports she is having issues staying organized, getting her work done in a timely manner, and feeling overwhelmed. Reports a long history of attention issues starting in childhood. Reports her brother and son have been diagnosed with attention deficit. She feels she has "managed" to get things done over the years, but has struggled more over the past few years. Reports difficulties at work and and in home settings. Stating "I feel like I'm struggling". Willing to consider treatment options.  Decreased interest and motivation. Taking medications as prescribed.  Energy levels stable. Active, has a regular exercise routine 4 days a week.  Enjoys some usual interests and activities. Divorced. Lives with her 53 year old son. Spending time with family. Appetite adequate. Weight loss - 90 pounds over the past year - [place]. Starting weight was 310 pounds - weight today 222 pounds. Reports struggling to get to sleep and if wakes up, has a difficult time getting back to sleep. Averages 6 hours. Focus and concentration difficulties. Completing tasks. Managing aspects of household. Works full time. Denies SI or HI.  Denies AH or VH. Denies self harm. Denies substance use. Reports tobacco use - less than a pack a day.  Previous medication trials:  Xanax  Visit Diagnosis: No diagnosis found.  Past  Psychiatric History: Denies psychiatric hospitalization.   Past Medical History:  Past Medical History:  Diagnosis Date   Arthritis    knees, ankles, neck, hands   Dental crowns present    x 2 - right side   High cholesterol    Nail deformity 03/2014   left thumb   Obesity    PONV (postoperative nausea and vomiting)    likes scopolamine patch   Seasonal allergies     Past Surgical History:  Procedure Laterality Date   ANKLE SURGERY Right 07/2012   repair of tendons and ligaments   ANTERIOR CRUCIATE LIGAMENT REPAIR  08/2001   left   CHOLECYSTECTOMY  11/2004   LAPAROSCOPIC INCISIONAL / UMBILICAL / VENTRAL HERNIA REPAIR  04/08/2011   ventral umbilical hernia repair   MASS EXCISION Right 12/25/2015   Procedure: EXCISION MASS RIGHT THUMB;  Surgeon: Cindee Salt, MD;  Location: [facility];  Service: Orthopedics;  Laterality: Right;   MENISCUS REPAIR Left 01/1998; 02/1999   NAILBED REPAIR Left 04/19/2014   Procedure: RECONSTRUCTION LEFT THUMB NAILBED WITH PALMARIS LONGUS GRAFT;  Surgeon: Nicki Reaper, MD;  Location: [facility];  Service: Orthopedics;  Laterality: Left;   NAILBED REPAIR Right 12/25/2015   Procedure: RECONSTRUCTION NAILBED RIGHT THUMB WITH POLLICIS LONGUS GRAFT;  Surgeon: Cindee Salt, MD;  Location: [facility];  Service: Orthopedics;  Laterality: Right;   TOTAL KNEE ARTHROPLASTY Left 10/20/2018   Procedure: TOTAL KNEE ARTHROPLASTY;  Surgeon: Eugenia Mcalpine, MD;  Location: [facility];  Service: Orthopedics;  Laterality: Left;   TRIGGER FINGER RELEASE Right 12/25/2015   Procedure: RELEASE A-1  PULLEY RIGHT THUMB;  Surgeon: Cindee Salt, MD;  Location: [facility];  Service: Orthopedics;  Laterality: Right;    Family Psychiatric History: Reports a family history of mental illness - sister.  Family History:  Family History  Problem Relation Age of Onset   Heart attack Father    Arthritis Mother    Diabetes Mother     Hyperlipidemia Brother    Diabetes Brother    Diabetes Sister    Breast cancer Neg Hx     Social History:  Social History   Socioeconomic History   Marital status: Divorced    Spouse name: Not on file   Number of children: Not on file   Years of education: Not on file   Highest education level: Not on file  Occupational History   Not on file  Tobacco Use   Smoking status: Every Day    Current packs/day: 0.50    Average packs/day: 0.5 packs/day for 30.0 years (15.0 ttl pk-yrs)    Types: Cigarettes   Smokeless tobacco: Never  Vaping Use   Vaping status: Never Used  Substance and Sexual Activity   Alcohol use: Yes    Comment: "rarely"   Drug use: No   Sexual activity: Not on file  Other Topics Concern   Not on file  Social History Narrative   Not on file   Social Determinants of Health   Financial Resource Strain: Not on file  Food Insecurity: Not on file  Transportation Needs: Not on file  Physical Activity: Not on file  Stress: Not on file  Social Connections: Not on file    Allergies: No Known Allergies  Metabolic Disorder Labs: No results found for: "HGBA1C", "MPG" No results found for: "PROLACTIN" No results found for: "CHOL", "TRIG", "HDL", "CHOLHDL", "VLDL", "LDLCALC" No results found for: "TSH"  Therapeutic Level Labs: No results found for: "LITHIUM" No results found for: "VALPROATE" No results found for: "CBMZ"  Current Medications: Current Outpatient Medications  Medication Sig Dispense Refill   atorvastatin (LIPITOR) 20 MG tablet Take 20 mg by mouth daily.       Cholecalciferol (VITAMIN D-3) 125 MCG (5000 UT) TABS Take 5,000 Units by mouth daily.     Dulaglutide (TRULICITY) 3 MG/0.5ML SOPN Inject 3 mg into the skin once a week. 2 mL 5   Dulaglutide (TRULICITY) 4.5 MG/0.5ML SOPN Inject 4.5 mg into the skin once a week. 2 mL 2   FLONASE SENSIMIST 27.5 MCG/SPRAY nasal spray Place 2 sprays into the nose daily as needed for allergies.      liothyronine (CYTOMEL) 5 MCG tablet Take 10 mcg by mouth daily.     loratadine (CLARITIN) 10 MG tablet Take 10 mg by mouth daily.     methocarbamol (ROBAXIN) 500 MG tablet Take 1 tablet (500 mg total) by mouth every 8 (eight) hours as needed for muscle spasms. 50 tablet 1   montelukast (SINGULAIR) 10 MG tablet Take 10 mg by mouth daily.      Multiple Vitamins-Minerals (MULTIVITAMIN ADULT PO) Take 1 packet by mouth daily. Appetite Support Weight Loss Packet Provided by [employer]     nicotine (NICODERM CQ - DOSED IN MG/24 HOURS) 21 mg/24hr patch Place 21 mg onto the skin daily. For 2 weeks then will decrease to 14 mg     nicotine polacrilex (NICORETTE) 4 MG gum Take 4 mg by mouth as needed for smoking cessation.     phentermine 15 MG capsule Take 15 mg by mouth every morning.  Took 1/2 tab     tirzepatide (MOUNJARO) 10 MG/0.5ML Pen Inject 10 mg into the skin once a week. 4 mL 5   tirzepatide (MOUNJARO) 10 MG/0.5ML Pen Inject 10 mg into the skin once a week. 6 mL 0   tirzepatide (MOUNJARO) 12.5 MG/0.5ML Pen Inject 12.5 mg into the skin once a week. 6 mL 0   tirzepatide (MOUNJARO) 7.5 MG/0.5ML Pen Inject 7.5 mg into the skin once a week. 2 mL 0   UNABLE TO FIND [employer] [facility] Appetite support Takes 2 packets per day     No current facility-administered medications for this visit.    Medication Side Effects: none  Orders placed this visit:  No orders of the defined types were placed in this encounter.   Psychiatric Specialty Exam:  Review of Systems  Musculoskeletal:  Negative for gait problem.  Neurological:  Negative for tremors.  Psychiatric/Behavioral:         Please refer to HPI    There were no vitals taken for this visit.There is no height or weight on file to calculate BMI.  General Appearance: Casual and Neat  Eye Contact:  Good  Speech:  Clear and Coherent and Normal Rate  Volume:  Normal  Mood:  Euthymic  Affect:  Appropriate and Congruent  Thought Process:  Coherent and  Descriptions of Associations: Intact  Orientation:  Full (Time, Place, and Person)  Thought Content: Logical   Suicidal Thoughts:  No  Homicidal Thoughts:  No  Memory:  WNL  Judgement:  Good  Insight:  Good  Psychomotor Activity:  Normal  Concentration:  Concentration: difficulties  and Attention Span: Good  Recall:  Good  Fund of Knowledge: Good  Language: Good  Assets:  Communication Skills Desire for Improvement Financial Resources/Insurance Housing Intimacy Leisure Time Physical Health Resilience Social Support Talents/Skills Transportation Vocational/Educational  ADL's:  Intact  Cognition: WNL  Prognosis:  Good   Screenings: MDQ  Receiving Psychotherapy: No   Treatment Plan/Recommendations:   Plan:  PDMP reviewed  Add Vyvanse 20mg  daily  [facility] ADHD test - 54/58 meets DSM criteria for ADD   RTC 4 weeks  Patient advised to contact office with any questions, adverse effects, or acute worsening in signs and symptoms.  Discussed potential benefits, risks, and side effects of stimulants with patient to include increased heart rate, palpitations, insomnia, increased anxiety, increased irritability, or decreased appetite.  Instructed patient to contact office if experiencing any significant tolerability issues.     Dorothyann Gibbs, NP

## 2023-05-05 DIAGNOSIS — M9901 Segmental and somatic dysfunction of cervical region: Secondary | ICD-10-CM | POA: Diagnosis not present

## 2023-05-05 DIAGNOSIS — M47812 Spondylosis without myelopathy or radiculopathy, cervical region: Secondary | ICD-10-CM | POA: Diagnosis not present

## 2023-05-05 DIAGNOSIS — M542 Cervicalgia: Secondary | ICD-10-CM | POA: Diagnosis not present

## 2023-05-05 DIAGNOSIS — M9902 Segmental and somatic dysfunction of thoracic region: Secondary | ICD-10-CM | POA: Diagnosis not present

## 2023-05-19 DIAGNOSIS — M47812 Spondylosis without myelopathy or radiculopathy, cervical region: Secondary | ICD-10-CM | POA: Diagnosis not present

## 2023-05-19 DIAGNOSIS — M9901 Segmental and somatic dysfunction of cervical region: Secondary | ICD-10-CM | POA: Diagnosis not present

## 2023-05-19 DIAGNOSIS — M542 Cervicalgia: Secondary | ICD-10-CM | POA: Diagnosis not present

## 2023-05-19 DIAGNOSIS — M9902 Segmental and somatic dysfunction of thoracic region: Secondary | ICD-10-CM | POA: Diagnosis not present

## 2023-05-25 ENCOUNTER — Ambulatory Visit: Payer: BC Managed Care – PPO | Admitting: Adult Health

## 2023-05-25 ENCOUNTER — Encounter: Payer: Self-pay | Admitting: Adult Health

## 2023-05-25 DIAGNOSIS — F902 Attention-deficit hyperactivity disorder, combined type: Secondary | ICD-10-CM | POA: Diagnosis not present

## 2023-05-25 MED ORDER — LISDEXAMFETAMINE DIMESYLATE 30 MG PO CAPS: 30.0000 mg | ORAL_CAPSULE | Freq: Every day | ORAL | 0 refills | Status: DC

## 2023-05-25 NOTE — Progress Notes (Addendum)
Jamie Paul [id number] 11/24/1960 62 y.o.  Subjective:   Patient ID:  Jamie Paul is a 62 y.o. (DOB Apr 02, 1961) female.  Chief Complaint: No chief complaint on file.   HPI Jamie Paul presents to the office today for follow-up of ADHD.   Describes mood today as "ok". Pleasant. Denies tearfulness. Mood symptoms - denies depression and irritability. Reports decreased anxiety. Denies panic attacks. Reports decreased worry, rumination, and over thinking. Mood is stable. Stating "I feel like I'm doing better". Feels like the addition of Vyvanse has been helpful. Would like to increase dose from 20mg  daily to 30mg  daily. Improved interest and motivation. Taking medications as prescribed.  Energy levels stable. Active, has a regular exercise routine 4 days a week.  Enjoys some usual interests and activities. Divorced. Lives with her 45 year old son. Spending time with family. Appetite adequate. Weight loss - 96 pounds over the past year - [place]jauro. Starting weight was 310 pounds - weight today 214pounds. Reports sleeping 6 to 7 hours of interrupted sleep. Focus and concentration improved with addition of Vyvanse. Completing tasks. Managing aspects of household. Works full time. Denies SI or HI.  Denies AH or VH. Denies self harm. Denies substance use. Reports tobacco use - less than a pack a day.  Previous medication trials:  Xanax    Review of Systems:  Review of Systems  Musculoskeletal:  Negative for gait problem.  Neurological:  Negative for tremors.  Psychiatric/Behavioral:         Please refer to HPI    Medications: I have reviewed the patient's current medications.  Current Outpatient Medications  Medication Sig Dispense Refill   atorvastatin (LIPITOR) 20 MG tablet Take 20 mg by mouth daily.       Cholecalciferol (VITAMIN D-3) 125 MCG (5000 UT) TABS Take 5,000 Units by mouth daily.     Dulaglutide (TRULICITY) 3 MG/0.5ML SOPN Inject 3 mg into the skin  once a week. 2 mL 5   Dulaglutide (TRULICITY) 4.5 MG/0.5ML SOPN Inject 4.5 mg into the skin once a week. 2 mL 2   FLONASE SENSIMIST 27.5 MCG/SPRAY nasal spray Place 2 sprays into the nose daily as needed for allergies.     liothyronine (CYTOMEL) 5 MCG tablet Take 10 mcg by mouth daily.     lisdexamfetamine (VYVANSE) 30 MG capsule Take 1 capsule (30 mg total) by mouth daily. 30 capsule 0   loratadine (CLARITIN) 10 MG tablet Take 10 mg by mouth daily.     methocarbamol (ROBAXIN) 500 MG tablet Take 1 tablet (500 mg total) by mouth every 8 (eight) hours as needed for muscle spasms. 50 tablet 1   montelukast (SINGULAIR) 10 MG tablet Take 10 mg by mouth daily.      Multiple Vitamins-Minerals (MULTIVITAMIN ADULT PO) Take 1 packet by mouth daily. Appetite Support Weight Loss Packet Provided by [employer]     nicotine (NICODERM CQ - DOSED IN MG/24 HOURS) 21 mg/24hr patch Place 21 mg onto the skin daily. For 2 weeks then will decrease to 14 mg     nicotine polacrilex (NICORETTE) 4 MG gum Take 4 mg by mouth as needed for smoking cessation.     phentermine 15 MG capsule Take 15 mg by mouth every morning. Took 1/2 tab     tirzepatide (MOUNJARO) 10 MG/0.5ML Pen Inject 10 mg into the skin once a week. 4 mL 5   tirzepatide (MOUNJARO) 10 MG/0.5ML Pen Inject 10 mg into the skin once a week. 6 mL  0   tirzepatide (MOUNJARO) 12.5 MG/0.5ML Pen Inject 12.5 mg into the skin once a week. 6 mL 0   tirzepatide (MOUNJARO) 7.5 MG/0.5ML Pen Inject 7.5 mg into the skin once a week. 2 mL 0   UNABLE TO FIND [facility] Appetite support Takes 2 packets per day     No current facility-administered medications for this visit.    Medication Side Effects: None  Allergies: No Known Allergies  Past Medical History:  Diagnosis Date   Arthritis    knees, ankles, neck, hands   Dental crowns present    x 2 - right side   High cholesterol    Nail deformity 03/2014   left thumb   Obesity    PONV (postoperative nausea and  vomiting)    likes scopolamine patch   Seasonal allergies     Past Medical History, Surgical history, Social history, and Family history were reviewed and updated as appropriate.   Please see review of systems for further details on the patient's review from today.   Objective:   Physical Exam:  There were no vitals taken for this visit.  Physical Exam Constitutional:      General: She is not in acute distress. Musculoskeletal:        General: No deformity.  Neurological:     Mental Status: She is alert and oriented to person, place, and time.     Coordination: Coordination normal.  Psychiatric:        Attention and Perception: Attention and perception normal. She does not perceive auditory or visual hallucinations.        Mood and Affect: Mood normal. Mood is not anxious or depressed. Affect is not labile, blunt, angry or inappropriate.        Speech: Speech normal.        Behavior: Behavior normal.        Thought Content: Thought content normal. Thought content is not paranoid or delusional. Thought content does not include homicidal or suicidal ideation. Thought content does not include homicidal or suicidal plan.        Cognition and Memory: Cognition and memory normal.        Judgment: Judgment normal.     Comments: Insight intact     Lab Review:     Component Value Date/Time   NA 139 10/21/2018 0337   K 4.2 10/21/2018 0337   CL 110 10/21/2018 0337   CO2 22 10/21/2018 0337   GLUCOSE 119 (H) 10/21/2018 0337   BUN 14 10/21/2018 0337   CREATININE 0.55 10/21/2018 0337   CALCIUM 8.8 (L) 10/21/2018 0337   GFRNONAA >60 10/21/2018 0337   GFRAA >60 10/21/2018 0337       Component Value Date/Time   WBC 12.1 (H) 10/22/2018 0338   RBC 4.55 10/22/2018 0338   HGB 13.0 10/22/2018 0338   HCT 41.4 10/22/2018 0338   PLT 235 10/22/2018 0338   MCV 91.0 10/22/2018 0338   MCH 28.6 10/22/2018 0338   MCHC 31.4 10/22/2018 0338   RDW 14.1 10/22/2018 0338   LYMPHSABS 2.9  04/06/2011 0850   MONOABS 0.8 04/06/2011 0850   EOSABS 0.1 04/06/2011 0850   BASOSABS 0.1 04/06/2011 0850    No results found for: "POCLITH", "LITHIUM"   No results found for: "PHENYTOIN", "PHENOBARB", "VALPROATE", "CBMZ"   .res Assessment: Plan:    Plan:  PDMP reviewed  Increase Vyvanse 20mg  to 30mg  daily  101/72/90   [facility] ADHD test - 54/58 meets DSM criteria  for ADD   RTC 4 weeks  Patient advised to contact office with any questions, adverse effects, or acute worsening in signs and symptoms.  Discussed potential benefits, risks, and side effects of stimulants with patient to include increased heart rate, palpitations, insomnia, increased anxiety, increased irritability, or decreased appetite.  Instructed patient to contact office if experiencing any significant tolerability issues.   Diagnoses and all orders for this visit:  Attention deficit hyperactivity disorder (ADHD), combined type -     lisdexamfetamine (VYVANSE) 30 MG capsule; Take 1 capsule (30 mg total) by mouth daily.     Please see After Visit Summary for patient specific instructions.  No future appointments.   No orders of the defined types were placed in this encounter.   -------------------------------

## 2023-06-03 ENCOUNTER — Other Ambulatory Visit (HOSPITAL_COMMUNITY): Payer: Self-pay

## 2023-06-03 DIAGNOSIS — E1122 Type 2 diabetes mellitus with diabetic chronic kidney disease: Secondary | ICD-10-CM | POA: Diagnosis not present

## 2023-06-03 MED ORDER — MOUNJARO 12.5 MG/0.5ML ~~LOC~~ SOAJ
12.5000 mg | SUBCUTANEOUS | 3 refills | Status: AC
  Filled 2023-06-03: qty 6, 84d supply, fill #0
  Filled 2023-08-21: qty 6, 84d supply, fill #1
  Filled 2023-11-13: qty 6, 84d supply, fill #2
  Filled 2024-02-18: qty 6, 84d supply, fill #3

## 2023-06-09 DIAGNOSIS — M9902 Segmental and somatic dysfunction of thoracic region: Secondary | ICD-10-CM | POA: Diagnosis not present

## 2023-06-09 DIAGNOSIS — M542 Cervicalgia: Secondary | ICD-10-CM | POA: Diagnosis not present

## 2023-06-09 DIAGNOSIS — M9901 Segmental and somatic dysfunction of cervical region: Secondary | ICD-10-CM | POA: Diagnosis not present

## 2023-06-09 DIAGNOSIS — M47812 Spondylosis without myelopathy or radiculopathy, cervical region: Secondary | ICD-10-CM | POA: Diagnosis not present

## 2023-06-21 ENCOUNTER — Encounter: Payer: Self-pay | Admitting: Adult Health

## 2023-06-21 ENCOUNTER — Ambulatory Visit: Payer: BC Managed Care – PPO | Admitting: Adult Health

## 2023-06-21 DIAGNOSIS — F902 Attention-deficit hyperactivity disorder, combined type: Secondary | ICD-10-CM | POA: Diagnosis not present

## 2023-06-21 MED ORDER — LISDEXAMFETAMINE DIMESYLATE 30 MG PO CAPS: 30.0000 mg | ORAL_CAPSULE | Freq: Every day | ORAL | 0 refills | Status: DC

## 2023-06-21 NOTE — Progress Notes (Signed)
Jamie Paul [id number] [birth year] 62 y.o.  Subjective:   Patient ID:  Jamie Paul is a 62 y.o. (DOB August 08, 1961) female.  Chief Complaint: No chief complaint on file.   HPI Jamie Paul presents to the office today for follow-up of ADHD.   Describes mood today as "ok". Pleasant. Denies tearfulness. Mood symptoms - denies depression, anxiety and irritability. Denies panic attacks. Reports decreased worry, rumination and over thinking. Mood is stable. Stating "I feel like I'm doing alright". Feels like the addition of Vyvanse has been helpful - would like to continue 30mg  daily. Improved interest and motivation. Taking medications as prescribed.  Energy levels stable. Active, has a regular exercise routine. Enjoys some usual interests and activities. Divorced. Lives with her 22 year old son - 22 cats. Spending time with family. Appetite adequate. Weight loss - Monjauro. Starting weight was 310 pounds a year ago - weight today 208 pounds. Reports sleeping 6 to 7 hours of interrupted sleep. Focus and concentration improved with addition of Vyvanse. Completing tasks. Managing aspects of household. Works full time. Denies SI or HI.  Denies AH or VH. Denies self harm. Denies substance use. Reports tobacco use - less than a pack a day.  Previous medication trials:  Xanax   Review of Systems:  Review of Systems  Musculoskeletal:  Negative for gait problem.  Neurological:  Negative for tremors.  Psychiatric/Behavioral:         Please refer to HPI    Medications: I have reviewed the patient's current medications.  Current Outpatient Medications  Medication Sig Dispense Refill   atorvastatin (LIPITOR) 20 MG tablet Take 20 mg by mouth daily.       Cholecalciferol (VITAMIN D-3) 125 MCG (5000 UT) TABS Take 5,000 Units by mouth daily.     Dulaglutide (TRULICITY) 3 MG/0.5ML SOPN Inject 3 mg into the skin once a week. 2 mL 5   Dulaglutide (TRULICITY) 4.5 MG/0.5ML SOPN Inject  4.5 mg into the skin once a week. 2 mL 2   FLONASE SENSIMIST 27.5 MCG/SPRAY nasal spray Place 2 sprays into the nose daily as needed for allergies.     liothyronine (CYTOMEL) 5 MCG tablet Take 10 mcg by mouth daily.     lisdexamfetamine (VYVANSE) 30 MG capsule Take 1 capsule (30 mg total) by mouth daily. 30 capsule 0   loratadine (CLARITIN) 10 MG tablet Take 10 mg by mouth daily.     methocarbamol (ROBAXIN) 500 MG tablet Take 1 tablet (500 mg total) by mouth every 8 (eight) hours as needed for muscle spasms. 50 tablet 1   montelukast (SINGULAIR) 10 MG tablet Take 10 mg by mouth daily.      Multiple Vitamins-Minerals (MULTIVITAMIN ADULT PO) Take 1 packet by mouth daily. Appetite Support Weight Loss Packet Provided by [employer]     nicotine (NICODERM CQ - DOSED IN MG/24 HOURS) 21 mg/24hr patch Place 21 mg onto the skin daily. For 2 weeks then will decrease to 14 mg     nicotine polacrilex (NICORETTE) 4 MG gum Take 4 mg by mouth as needed for smoking cessation.     phentermine 15 MG capsule Take 15 mg by mouth every morning. Took 1/2 tab     tirzepatide (MOUNJARO) 10 MG/0.5ML Pen Inject 10 mg into the skin once a week. 4 mL 5   tirzepatide (MOUNJARO) 10 MG/0.5ML Pen Inject 10 mg into the skin once a week. 6 mL 0   tirzepatide (MOUNJARO) 12.5 MG/0.5ML Pen Inject 12.5 mg into  the skin once a week. 6 mL 0   tirzepatide (MOUNJARO) 12.5 MG/0.5ML Pen Inject 12.5 mg into the skin once a week. 6 mL 3   tirzepatide (MOUNJARO) 7.5 MG/0.5ML Pen Inject 7.5 mg into the skin once a week. 2 mL 0   UNABLE TO FIND [facility] Appetite support Takes 2 packets per day     No current facility-administered medications for this visit.    Medication Side Effects: None  Allergies: No Known Allergies  Past Medical History:  Diagnosis Date   Arthritis    knees, ankles, neck, hands   Dental crowns present    x 2 - right side   High cholesterol    Nail deformity 03/2014   left thumb   Obesity    PONV  (postoperative nausea and vomiting)    likes scopolamine patch   Seasonal allergies     Past Medical History, Surgical history, Social history, and Family history were reviewed and updated as appropriate.   Please see review of systems for further details on the patient's review from today.   Objective:   Physical Exam:  There were no vitals taken for this visit.  Physical Exam Constitutional:      General: She is not in acute distress. Musculoskeletal:        General: No deformity.  Neurological:     Mental Status: She is alert and oriented to person, place, and time.     Coordination: Coordination normal.  Psychiatric:        Attention and Perception: Attention and perception normal. She does not perceive auditory or visual hallucinations.        Mood and Affect: Affect is not labile, blunt, angry or inappropriate.        Speech: Speech normal.        Behavior: Behavior normal.        Thought Content: Thought content normal. Thought content is not paranoid or delusional. Thought content does not include homicidal or suicidal ideation. Thought content does not include homicidal or suicidal plan.        Cognition and Memory: Cognition and memory normal.        Judgment: Judgment normal.     Comments: Insight intact     Lab Review:     Component Value Date/Time   NA 139 10/21/2018 0337   K 4.2 10/21/2018 0337   CL 110 10/21/2018 0337   CO2 22 10/21/2018 0337   GLUCOSE 119 (H) 10/21/2018 0337   BUN 14 10/21/2018 0337   CREATININE 0.55 10/21/2018 0337   CALCIUM 8.8 (L) 10/21/2018 0337   GFRNONAA >60 10/21/2018 0337   GFRAA >60 10/21/2018 0337       Component Value Date/Time   WBC 12.1 (H) 10/22/2018 0338   RBC 4.55 10/22/2018 0338   HGB 13.0 10/22/2018 0338   HCT 41.4 10/22/2018 0338   PLT 235 10/22/2018 0338   MCV 91.0 10/22/2018 0338   MCH 28.6 10/22/2018 0338   MCHC 31.4 10/22/2018 0338   RDW 14.1 10/22/2018 0338   LYMPHSABS 2.9 04/06/2011 0850   MONOABS  0.8 04/06/2011 0850   EOSABS 0.1 04/06/2011 0850   BASOSABS 0.1 04/06/2011 0850    No results found for: "POCLITH", "LITHIUM"   No results found for: "PHENYTOIN", "PHENOBARB", "VALPROATE", "CBMZ"   .res Assessment: Plan:    Plan:  PDMP reviewed  Vyvanse 30mg  daily  [facility] ADHD test - 54/58 meets DSM criteria for ADD   RTC 3 months  Patient advised to contact office with any questions, adverse effects, or acute worsening in signs and symptoms.  Discussed potential benefits, risks, and side effects of stimulants with patient to include increased heart rate, palpitations, insomnia, increased anxiety, increased irritability, or decreased appetite.  Instructed patient to contact office if experiencing any significant tolerability issues.   There are no diagnoses linked to this encounter.   Please see After Visit Summary for patient specific instructions.  No future appointments.  No orders of the defined types were placed in this encounter.   -------------------------------

## 2023-06-23 DIAGNOSIS — M9902 Segmental and somatic dysfunction of thoracic region: Secondary | ICD-10-CM | POA: Diagnosis not present

## 2023-06-23 DIAGNOSIS — M542 Cervicalgia: Secondary | ICD-10-CM | POA: Diagnosis not present

## 2023-06-23 DIAGNOSIS — M47812 Spondylosis without myelopathy or radiculopathy, cervical region: Secondary | ICD-10-CM | POA: Diagnosis not present

## 2023-06-23 DIAGNOSIS — M9901 Segmental and somatic dysfunction of cervical region: Secondary | ICD-10-CM | POA: Diagnosis not present

## 2023-07-07 DIAGNOSIS — M9902 Segmental and somatic dysfunction of thoracic region: Secondary | ICD-10-CM | POA: Diagnosis not present

## 2023-07-07 DIAGNOSIS — M542 Cervicalgia: Secondary | ICD-10-CM | POA: Diagnosis not present

## 2023-07-07 DIAGNOSIS — M9901 Segmental and somatic dysfunction of cervical region: Secondary | ICD-10-CM | POA: Diagnosis not present

## 2023-07-07 DIAGNOSIS — M47812 Spondylosis without myelopathy or radiculopathy, cervical region: Secondary | ICD-10-CM | POA: Diagnosis not present

## 2023-07-21 DIAGNOSIS — M9901 Segmental and somatic dysfunction of cervical region: Secondary | ICD-10-CM | POA: Diagnosis not present

## 2023-07-21 DIAGNOSIS — M9902 Segmental and somatic dysfunction of thoracic region: Secondary | ICD-10-CM | POA: Diagnosis not present

## 2023-07-21 DIAGNOSIS — M542 Cervicalgia: Secondary | ICD-10-CM | POA: Diagnosis not present

## 2023-07-21 DIAGNOSIS — M47812 Spondylosis without myelopathy or radiculopathy, cervical region: Secondary | ICD-10-CM | POA: Diagnosis not present

## 2023-08-04 DIAGNOSIS — M47812 Spondylosis without myelopathy or radiculopathy, cervical region: Secondary | ICD-10-CM | POA: Diagnosis not present

## 2023-08-04 DIAGNOSIS — M9902 Segmental and somatic dysfunction of thoracic region: Secondary | ICD-10-CM | POA: Diagnosis not present

## 2023-08-04 DIAGNOSIS — M542 Cervicalgia: Secondary | ICD-10-CM | POA: Diagnosis not present

## 2023-08-04 DIAGNOSIS — M9901 Segmental and somatic dysfunction of cervical region: Secondary | ICD-10-CM | POA: Diagnosis not present

## 2023-08-08 DIAGNOSIS — M542 Cervicalgia: Secondary | ICD-10-CM | POA: Diagnosis not present

## 2023-08-08 DIAGNOSIS — M9901 Segmental and somatic dysfunction of cervical region: Secondary | ICD-10-CM | POA: Diagnosis not present

## 2023-08-08 DIAGNOSIS — M9902 Segmental and somatic dysfunction of thoracic region: Secondary | ICD-10-CM | POA: Diagnosis not present

## 2023-08-08 DIAGNOSIS — M47812 Spondylosis without myelopathy or radiculopathy, cervical region: Secondary | ICD-10-CM | POA: Diagnosis not present

## 2023-08-18 DIAGNOSIS — M9902 Segmental and somatic dysfunction of thoracic region: Secondary | ICD-10-CM | POA: Diagnosis not present

## 2023-08-18 DIAGNOSIS — M9901 Segmental and somatic dysfunction of cervical region: Secondary | ICD-10-CM | POA: Diagnosis not present

## 2023-08-18 DIAGNOSIS — M47812 Spondylosis without myelopathy or radiculopathy, cervical region: Secondary | ICD-10-CM | POA: Diagnosis not present

## 2023-08-18 DIAGNOSIS — M542 Cervicalgia: Secondary | ICD-10-CM | POA: Diagnosis not present

## 2023-08-22 ENCOUNTER — Other Ambulatory Visit: Payer: Self-pay

## 2023-08-23 ENCOUNTER — Other Ambulatory Visit (HOSPITAL_COMMUNITY): Payer: Self-pay

## 2023-09-01 DIAGNOSIS — M9902 Segmental and somatic dysfunction of thoracic region: Secondary | ICD-10-CM | POA: Diagnosis not present

## 2023-09-01 DIAGNOSIS — M9901 Segmental and somatic dysfunction of cervical region: Secondary | ICD-10-CM | POA: Diagnosis not present

## 2023-09-01 DIAGNOSIS — M47812 Spondylosis without myelopathy or radiculopathy, cervical region: Secondary | ICD-10-CM | POA: Diagnosis not present

## 2023-09-01 DIAGNOSIS — M542 Cervicalgia: Secondary | ICD-10-CM | POA: Diagnosis not present

## 2023-09-14 ENCOUNTER — Ambulatory Visit: Payer: BC Managed Care – PPO | Admitting: Adult Health

## 2023-09-14 ENCOUNTER — Encounter: Payer: Self-pay | Admitting: Adult Health

## 2023-09-14 DIAGNOSIS — F902 Attention-deficit hyperactivity disorder, combined type: Secondary | ICD-10-CM

## 2023-09-14 MED ORDER — LISDEXAMFETAMINE DIMESYLATE 30 MG PO CAPS: 30.0000 mg | ORAL_CAPSULE | Freq: Every day | ORAL | 0 refills | Status: DC

## 2023-09-14 MED ORDER — LISDEXAMFETAMINE DIMESYLATE 30 MG PO CAPS
30.0000 mg | ORAL_CAPSULE | Freq: Every day | ORAL | 0 refills | Status: DC
Start: 2023-09-14 — End: 2023-12-07

## 2023-09-14 NOTE — Progress Notes (Signed)
Davinah Alsbury [id number] May 27, 1961 62 y.o.  Subjective:   Patient ID:  Jamie Paul is a 62 y.o. (DOB 1960/11/11) female.  Chief Complaint: No chief complaint on file.   HPI Jamie Paul presents to the office today for follow-up of ADHD.   Describes mood today as "ok". Pleasant. Denies tearfulness. Mood symptoms - denies depression, anxiety and irritability. Denies panic attacks. Denies  worry, rumination and over thinking. Mood is consistent. Stating "I feel like I'm doing good ". Feels like the Vyvanse continues to work well. Improved interest and motivation. Taking medications as prescribed.  Energy levels stable. Active, has a regular exercise routine. Enjoys some usual interests and activities. Divorced. Lives with Jamie Paul 32 year old son - 2 cats. Spending time with family. Appetite adequate. Reports weight loss - Monjauro.  Reports sleeping 6 to 7 hours of interrupted sleep. Focus and concentration improved with addition of Vyvanse. Completing tasks. Managing aspects of household. Works full time. Denies SI or HI.  Denies AH or VH. Denies self harm. Denies substance use. Reports tobacco use - less than a pack a day.  Previous medication trials:  Xanax   Review of Systems:  Review of Systems  Musculoskeletal:  Negative for gait problem.  Neurological:  Negative for tremors.  Psychiatric/Behavioral:         Please refer to HPI    Medications: I have reviewed the patient's current medications.  Current Outpatient Medications  Medication Sig Dispense Refill   atorvastatin (LIPITOR) 20 MG tablet Take 20 mg by mouth daily.       Cholecalciferol (VITAMIN D-3) 125 MCG (5000 UT) TABS Take 5,000 Units by mouth daily.     Dulaglutide (TRULICITY) 3 MG/0.5ML SOPN Inject 3 mg into the skin once a week. 2 mL 5   Dulaglutide (TRULICITY) 4.5 MG/0.5ML SOPN Inject 4.5 mg into the skin once a week. 2 mL 2   FLONASE SENSIMIST 27.5 MCG/SPRAY nasal spray Place 2 sprays into  the nose daily as needed for allergies.     liothyronine (CYTOMEL) 5 MCG tablet Take 10 mcg by mouth daily.     lisdexamfetamine (VYVANSE) 30 MG capsule Take 1 capsule (30 mg total) by mouth daily. 30 capsule 0   lisdexamfetamine (VYVANSE) 30 MG capsule Take 1 capsule (30 mg total) by mouth daily. 30 capsule 0   lisdexamfetamine (VYVANSE) 30 MG capsule Take 1 capsule (30 mg total) by mouth daily. 30 capsule 0   loratadine (CLARITIN) 10 MG tablet Take 10 mg by mouth daily.     methocarbamol (ROBAXIN) 500 MG tablet Take 1 tablet (500 mg total) by mouth every 8 (eight) hours as needed for muscle spasms. 50 tablet 1   montelukast (SINGULAIR) 10 MG tablet Take 10 mg by mouth daily.      Multiple Vitamins-Minerals (MULTIVITAMIN ADULT PO) Take 1 packet by mouth daily. Appetite Support Weight Loss Packet Provided by [employer]     nicotine (NICODERM CQ - DOSED IN MG/24 HOURS) 21 mg/24hr patch Place 21 mg onto the skin daily. For 2 weeks then will decrease to 14 mg     nicotine polacrilex (NICORETTE) 4 MG gum Take 4 mg by mouth as needed for smoking cessation.     phentermine 15 MG capsule Take 15 mg by mouth every morning. Took 1/2 tab     tirzepatide (MOUNJARO) 10 MG/0.5ML Pen Inject 10 mg into the skin once a week. 4 mL 5   tirzepatide (MOUNJARO) 10 MG/0.5ML Pen Inject 10 mg  into the skin once a week. 6 mL 0   tirzepatide (MOUNJARO) 12.5 MG/0.5ML Pen Inject 12.5 mg into the skin once a week. 6 mL 0   tirzepatide (MOUNJARO) 12.5 MG/0.5ML Pen Inject 12.5 mg into the skin once a week. 6 mL 3   tirzepatide (MOUNJARO) 7.5 MG/0.5ML Pen Inject 7.5 mg into the skin once a week. 2 mL 0   UNABLE TO FIND [facility] Appetite support Takes 2 packets per day     No current facility-administered medications for this visit.    Medication Side Effects: None  Allergies: No Known Allergies  Past Medical History:  Diagnosis Date   Arthritis    knees, ankles, neck, hands   Dental crowns present    x 2 - right  side   High cholesterol    Nail deformity 03/2014   left thumb   Obesity    PONV (postoperative nausea and vomiting)    likes scopolamine patch   Seasonal allergies     Past Medical History, Surgical history, Social history, and Family history were reviewed and updated as appropriate.   Please see review of systems for further details on the patient's review from today.   Objective:   Physical Exam:  There were no vitals taken for this visit.  Physical Exam Constitutional:      General: Jamie Paul is not in acute distress. Musculoskeletal:        General: No deformity.  Neurological:     Mental Status: Jamie Paul is alert and oriented to person, place, and time.     Coordination: Coordination normal.  Psychiatric:        Attention and Perception: Attention and perception normal. Jamie Paul does not perceive auditory or visual hallucinations.        Mood and Affect: Mood normal. Mood is not anxious or depressed. Affect is not labile, blunt, angry or inappropriate.        Speech: Speech normal.        Behavior: Behavior normal.        Thought Content: Thought content normal. Thought content is not paranoid or delusional. Thought content does not include homicidal or suicidal ideation. Thought content does not include homicidal or suicidal plan.        Cognition and Memory: Cognition and memory normal.        Judgment: Judgment normal.     Comments: Insight intact     Lab Review:     Component Value Date/Time   NA 139 10/21/2018 0337   K 4.2 10/21/2018 0337   CL 110 10/21/2018 0337   CO2 22 10/21/2018 0337   GLUCOSE 119 (H) 10/21/2018 0337   BUN 14 10/21/2018 0337   CREATININE 0.55 10/21/2018 0337   CALCIUM 8.8 (L) 10/21/2018 0337   GFRNONAA >60 10/21/2018 0337   GFRAA >60 10/21/2018 0337       Component Value Date/Time   WBC 12.1 (H) 10/22/2018 0338   RBC 4.55 10/22/2018 0338   HGB 13.0 10/22/2018 0338   HCT 41.4 10/22/2018 0338   PLT 235 10/22/2018 0338   MCV 91.0 10/22/2018  0338   MCH 28.6 10/22/2018 0338   MCHC 31.4 10/22/2018 0338   RDW 14.1 10/22/2018 0338   LYMPHSABS 2.9 04/06/2011 0850   MONOABS 0.8 04/06/2011 0850   EOSABS 0.1 04/06/2011 0850   BASOSABS 0.1 04/06/2011 0850    No results found for: "POCLITH", "LITHIUM"   No results found for: "PHENYTOIN", "PHENOBARB", "VALPROATE", "CBMZ"   .res Assessment: Plan:  Plan:  PDMP reviewed  Vyvanse 30mg  daily  [facility] ADHD test - 54/58 meets DSM criteria for ADD   RTC 3 months  Patient advised to contact office with any questions, adverse effects, or acute worsening in signs and symptoms.  Discussed potential benefits, risks, and side effects of stimulants with patient to include increased heart rate, palpitations, insomnia, increased anxiety, increased irritability, or decreased appetite.  Instructed patient to contact office if experiencing any significant tolerability issues.  There are no diagnoses linked to this encounter.   Please see After Visit Summary for patient specific instructions.  Future Appointments  Date Time Provider Department Center  09/14/2023  8:00 AM Kadeen Sroka, Thereasa Solo, NP [facility] None    No orders of the defined types were placed in this encounter.   -------------------------------

## 2023-09-15 DIAGNOSIS — M9901 Segmental and somatic dysfunction of cervical region: Secondary | ICD-10-CM | POA: Diagnosis not present

## 2023-09-15 DIAGNOSIS — M9902 Segmental and somatic dysfunction of thoracic region: Secondary | ICD-10-CM | POA: Diagnosis not present

## 2023-09-15 DIAGNOSIS — M542 Cervicalgia: Secondary | ICD-10-CM | POA: Diagnosis not present

## 2023-09-15 DIAGNOSIS — M47812 Spondylosis without myelopathy or radiculopathy, cervical region: Secondary | ICD-10-CM | POA: Diagnosis not present

## 2023-10-13 DIAGNOSIS — M47812 Spondylosis without myelopathy or radiculopathy, cervical region: Secondary | ICD-10-CM | POA: Diagnosis not present

## 2023-10-13 DIAGNOSIS — M542 Cervicalgia: Secondary | ICD-10-CM | POA: Diagnosis not present

## 2023-10-13 DIAGNOSIS — M9902 Segmental and somatic dysfunction of thoracic region: Secondary | ICD-10-CM | POA: Diagnosis not present

## 2023-10-13 DIAGNOSIS — M9901 Segmental and somatic dysfunction of cervical region: Secondary | ICD-10-CM | POA: Diagnosis not present

## 2023-10-27 DIAGNOSIS — M542 Cervicalgia: Secondary | ICD-10-CM | POA: Diagnosis not present

## 2023-10-27 DIAGNOSIS — M9901 Segmental and somatic dysfunction of cervical region: Secondary | ICD-10-CM | POA: Diagnosis not present

## 2023-10-27 DIAGNOSIS — M9902 Segmental and somatic dysfunction of thoracic region: Secondary | ICD-10-CM | POA: Diagnosis not present

## 2023-10-27 DIAGNOSIS — M47812 Spondylosis without myelopathy or radiculopathy, cervical region: Secondary | ICD-10-CM | POA: Diagnosis not present

## 2023-11-10 DIAGNOSIS — M9902 Segmental and somatic dysfunction of thoracic region: Secondary | ICD-10-CM | POA: Diagnosis not present

## 2023-11-10 DIAGNOSIS — M542 Cervicalgia: Secondary | ICD-10-CM | POA: Diagnosis not present

## 2023-11-10 DIAGNOSIS — M9901 Segmental and somatic dysfunction of cervical region: Secondary | ICD-10-CM | POA: Diagnosis not present

## 2023-11-10 DIAGNOSIS — M47812 Spondylosis without myelopathy or radiculopathy, cervical region: Secondary | ICD-10-CM | POA: Diagnosis not present

## 2023-11-21 ENCOUNTER — Other Ambulatory Visit (HOSPITAL_COMMUNITY): Payer: Self-pay

## 2023-11-22 ENCOUNTER — Other Ambulatory Visit (HOSPITAL_COMMUNITY): Payer: Self-pay

## 2023-11-22 ENCOUNTER — Other Ambulatory Visit: Payer: Self-pay | Admitting: Family Medicine

## 2023-11-22 DIAGNOSIS — E785 Hyperlipidemia, unspecified: Secondary | ICD-10-CM | POA: Diagnosis not present

## 2023-11-22 DIAGNOSIS — Z0000 Encounter for general adult medical examination without abnormal findings: Secondary | ICD-10-CM | POA: Diagnosis not present

## 2023-11-22 DIAGNOSIS — Z87891 Personal history of nicotine dependence: Secondary | ICD-10-CM

## 2023-11-22 DIAGNOSIS — J302 Other seasonal allergic rhinitis: Secondary | ICD-10-CM | POA: Diagnosis not present

## 2023-11-22 DIAGNOSIS — R7303 Prediabetes: Secondary | ICD-10-CM | POA: Diagnosis not present

## 2023-11-22 DIAGNOSIS — M1990 Unspecified osteoarthritis, unspecified site: Secondary | ICD-10-CM | POA: Diagnosis not present

## 2023-11-22 MED ORDER — MOUNJARO 12.5 MG/0.5ML ~~LOC~~ SOAJ
12.5000 mg | SUBCUTANEOUS | 3 refills | Status: DC
  Filled 2023-11-22: qty 6, 84d supply, fill #0

## 2023-11-22 MED ORDER — MOUNJARO 12.5 MG/0.5ML ~~LOC~~ SOAJ
12.5000 mg | SUBCUTANEOUS | 3 refills | Status: AC
  Filled 2023-11-22: qty 6, 84d supply, fill #0

## 2023-11-23 DIAGNOSIS — M47812 Spondylosis without myelopathy or radiculopathy, cervical region: Secondary | ICD-10-CM | POA: Diagnosis not present

## 2023-11-23 DIAGNOSIS — M9902 Segmental and somatic dysfunction of thoracic region: Secondary | ICD-10-CM | POA: Diagnosis not present

## 2023-11-23 DIAGNOSIS — M542 Cervicalgia: Secondary | ICD-10-CM | POA: Diagnosis not present

## 2023-11-23 DIAGNOSIS — M9901 Segmental and somatic dysfunction of cervical region: Secondary | ICD-10-CM | POA: Diagnosis not present

## 2023-12-07 ENCOUNTER — Encounter: Payer: Self-pay | Admitting: Adult Health

## 2023-12-07 ENCOUNTER — Telehealth: Payer: BC Managed Care – PPO | Admitting: Adult Health

## 2023-12-07 DIAGNOSIS — F902 Attention-deficit hyperactivity disorder, combined type: Secondary | ICD-10-CM

## 2023-12-07 DIAGNOSIS — F909 Attention-deficit hyperactivity disorder, unspecified type: Secondary | ICD-10-CM | POA: Diagnosis not present

## 2023-12-07 MED ORDER — LISDEXAMFETAMINE DIMESYLATE 30 MG PO CAPS
30.0000 mg | ORAL_CAPSULE | Freq: Every day | ORAL | 0 refills | Status: DC
Start: 2024-02-01 — End: 2024-03-08

## 2023-12-07 MED ORDER — LISDEXAMFETAMINE DIMESYLATE 30 MG PO CAPS: 30.0000 mg | ORAL_CAPSULE | Freq: Every day | ORAL | 0 refills | Status: DC

## 2023-12-07 MED ORDER — LISDEXAMFETAMINE DIMESYLATE 30 MG PO CAPS
30.0000 mg | ORAL_CAPSULE | Freq: Every day | ORAL | 0 refills | Status: DC
Start: 2023-12-07 — End: 2024-03-08

## 2023-12-07 NOTE — Progress Notes (Signed)
 Jamie Paul [id number] 15-Jul-1961 63 y.o.  Virtual Visit via Video Note  I connected with pt @ on 12/07/23 at  8:00 AM EDT by a video enabled telemedicine application and verified that I am speaking with the correct person using two identifiers.   I discussed the limitations of evaluation and management by telemedicine and the availability of in person appointments. The patient expressed understanding and agreed to proceed.  I discussed the assessment and treatment plan with the patient. The patient was provided an opportunity to ask questions and all were answered. The patient agreed with the plan and demonstrated an understanding of the instructions.   The patient was advised to call back or seek an in-person evaluation if the symptoms worsen or if the condition fails to improve as anticipated.  I provided 10 minutes of non-face-to-face time during this encounter.  The patient was located at home.  The provider was located at [facility].   Dorothyann Gibbs, NP   Subjective:   Patient ID:  Jamie Paul is a 63 y.o. (DOB 08/27/1961) female.  Chief Complaint: No chief complaint on file.   HPI Jamie Paul presents for follow-up of ADHD.   Describes mood today as "ok". Pleasant. Denies tearfulness. Mood symptoms - denies depression, anxiety and irritability. Reports stable interest and motivation.Denies panic attacks. Denies  worry and rumination. Reports some over thinking. Mood is consistent. Stating "I feel like I'm doing ok ". Feels like the Vyvanse continues to work well. Taking medications as prescribed.  Energy levels stable. Active, has a regular exercise routine. Enjoys some usual interests and activities. Divorced. Lives with her 71 year old son - 2 cats. Spending time with family. Appetite adequate. Reports weight is stable - 184 pounds - [place].  Reports sleeping well most nights. Averages 6 to 7 hours. Focus and concentration improved with  addition of Vyvanse. Completing tasks. Managing aspects of household. Works full time. Denies SI or HI.  Denies AH or VH. Denies self harm. Denies substance use. Has stopped tobacco use - January 2025.    Previous medication trials:  Xanax   Review of Systems:  Review of Systems  Musculoskeletal:  Negative for gait problem.  Neurological:  Negative for tremors.  Psychiatric/Behavioral:         Please refer to HPI    Medications: I have reviewed the patient's current medications.  Current Outpatient Medications  Medication Sig Dispense Refill   atorvastatin (LIPITOR) 20 MG tablet Take 20 mg by mouth daily.       Cholecalciferol (VITAMIN D-3) 125 MCG (5000 UT) TABS Take 5,000 Units by mouth daily.     Dulaglutide (TRULICITY) 3 MG/0.5ML SOPN Inject 3 mg into the skin once a week. 2 mL 5   Dulaglutide (TRULICITY) 4.5 MG/0.5ML SOPN Inject 4.5 mg into the skin once a week. 2 mL 2   FLONASE SENSIMIST 27.5 MCG/SPRAY nasal spray Place 2 sprays into the nose daily as needed for allergies.     liothyronine (CYTOMEL) 5 MCG tablet Take 10 mcg by mouth daily.     lisdexamfetamine (VYVANSE) 30 MG capsule Take 1 capsule (30 mg total) by mouth daily. 30 capsule 0   lisdexamfetamine (VYVANSE) 30 MG capsule Take 1 capsule (30 mg total) by mouth daily. 30 capsule 0   lisdexamfetamine (VYVANSE) 30 MG capsule Take 1 capsule (30 mg total) by mouth daily. 30 capsule 0   loratadine (CLARITIN) 10 MG tablet Take 10 mg by mouth daily.  methocarbamol (ROBAXIN) 500 MG tablet Take 1 tablet (500 mg total) by mouth every 8 (eight) hours as needed for muscle spasms. 50 tablet 1   montelukast (SINGULAIR) 10 MG tablet Take 10 mg by mouth daily.      Multiple Vitamins-Minerals (MULTIVITAMIN ADULT PO) Take 1 packet by mouth daily. Appetite Support Weight Loss Packet Provided by [employer]     nicotine (NICODERM CQ - DOSED IN MG/24 HOURS) 21 mg/24hr patch Place 21 mg onto the skin daily. For 2 weeks then will decrease  to 14 mg     nicotine polacrilex (NICORETTE) 4 MG gum Take 4 mg by mouth as needed for smoking cessation.     phentermine 15 MG capsule Take 15 mg by mouth every morning. Took 1/2 tab     tirzepatide (MOUNJARO) 10 MG/0.5ML Pen Inject 10 mg into the skin once a week. 4 mL 5   tirzepatide (MOUNJARO) 10 MG/0.5ML Pen Inject 10 mg into the skin once a week. 6 mL 0   tirzepatide (MOUNJARO) 12.5 MG/0.5ML Pen Inject 12.5 mg into the skin once a week. 6 mL 0   tirzepatide (MOUNJARO) 12.5 MG/0.5ML Pen Inject 12.5 mg into the skin once a week. 6 mL 3   tirzepatide (MOUNJARO) 12.5 MG/0.5ML Pen Inject 12.5 mg into the skin once a week. 6 mL 3   tirzepatide (MOUNJARO) 7.5 MG/0.5ML Pen Inject 7.5 mg into the skin once a week. 2 mL 0   UNABLE TO FIND [employer] [facility] Appetite support Takes 2 packets per day     No current facility-administered medications for this visit.    Medication Side Effects: None  Allergies:  Allergies  Allergen Reactions   Meloxicam Other (See Comments)    Past Medical History:  Diagnosis Date   Arthritis    knees, ankles, neck, hands   Dental crowns present    x 2 - right side   High cholesterol    Nail deformity 03/2014   left thumb   Obesity    PONV (postoperative nausea and vomiting)    likes scopolamine patch   Seasonal allergies     Family History  Problem Relation Age of Onset   Heart attack Father    Arthritis Mother    Diabetes Mother    Hyperlipidemia Brother    Diabetes Brother    Diabetes Sister    Breast cancer Neg Hx     Social History   Socioeconomic History   Marital status: Divorced    Spouse name: Not on file   Number of children: Not on file   Years of education: Not on file   Highest education level: Not on file  Occupational History   Not on file  Tobacco Use   Smoking status: Every Day    Current packs/day: 0.50    Average packs/day: 0.5 packs/day for 30.0 years (15.0 ttl pk-yrs)    Types: Cigarettes   Smokeless tobacco: Never   Vaping Use   Vaping status: Never Used  Substance and Sexual Activity   Alcohol use: Yes    Comment: "rarely"   Drug use: No   Sexual activity: Not on file  Other Topics Concern   Not on file  Social History Narrative   Not on file   Social Drivers of Health   Financial Resource Strain: Not on file  Food Insecurity: Not on file  Transportation Needs: Not on file  Physical Activity: Not on file  Stress: Not on file  Social Connections: Not on  file  Intimate Partner Violence: Not on file    Past Medical History, Surgical history, Social history, and Family history were reviewed and updated as appropriate.   Please see review of systems for further details on the patient's review from today.   Objective:   Physical Exam:  There were no vitals taken for this visit.  Physical Exam Constitutional:      General: She is not in acute distress. Musculoskeletal:        General: No deformity.  Neurological:     Mental Status: She is alert and oriented to person, place, and time.     Coordination: Coordination normal.  Psychiatric:        Attention and Perception: Attention and perception normal. She does not perceive auditory or visual hallucinations.        Mood and Affect: Affect is not labile, blunt, angry or inappropriate.        Speech: Speech normal.        Behavior: Behavior normal.        Thought Content: Thought content normal. Thought content is not paranoid or delusional. Thought content does not include homicidal or suicidal ideation. Thought content does not include homicidal or suicidal plan.        Cognition and Memory: Cognition and memory normal.        Judgment: Judgment normal.     Comments: Insight intact     Lab Review:     Component Value Date/Time   NA 139 10/21/2018 0337   K 4.2 10/21/2018 0337   CL 110 10/21/2018 0337   CO2 22 10/21/2018 0337   GLUCOSE 119 (H) 10/21/2018 0337   BUN 14 10/21/2018 0337   CREATININE 0.55 10/21/2018 0337    CALCIUM 8.8 (L) 10/21/2018 0337   GFRNONAA >60 10/21/2018 0337   GFRAA >60 10/21/2018 0337       Component Value Date/Time   WBC 12.1 (H) 10/22/2018 0338   RBC 4.55 10/22/2018 0338   HGB 13.0 10/22/2018 0338   HCT 41.4 10/22/2018 0338   PLT 235 10/22/2018 0338   MCV 91.0 10/22/2018 0338   MCH 28.6 10/22/2018 0338   MCHC 31.4 10/22/2018 0338   RDW 14.1 10/22/2018 0338   LYMPHSABS 2.9 04/06/2011 0850   MONOABS 0.8 04/06/2011 0850   EOSABS 0.1 04/06/2011 0850   BASOSABS 0.1 04/06/2011 0850    No results found for: "POCLITH", "LITHIUM"   No results found for: "PHENYTOIN", "PHENOBARB", "VALPROATE", "CBMZ"   .res Assessment: Plan:    Plan:  PDMP reviewed  Vyvanse 30mg  daily  [facility] ADHD test - 54/58 meets DSM criteria for ADD   Advised to monitor BP between visits while taking stimulant medication.   RTC 3 months  10 minutes spent dedicated to the care of this patient on the date of this encounter to include pre-visit review of records, ordering of medication, post visit documentation, and face-to-face time with the patient discussing ADD. Discussed continuing current medication regimen.  Patient advised to contact office with any questions, adverse effects, or acute worsening in signs and symptoms.  Discussed potential benefits, risks, and side effects of stimulants with patient to include increased heart rate, palpitations, insomnia, increased anxiety, increased irritability, or decreased appetite.  Instructed patient to contact office if experiencing any significant tolerability issues.   There are no diagnoses linked to this encounter.   Please see After Visit Summary for patient specific instructions.  Future Appointments  Date Time Provider Department Center  12/07/2023  8:00 AM Yoan Sallade, Thereasa Solo, NP [facility] None    No orders of the defined types were placed in this encounter.     -------------------------------

## 2024-01-03 DIAGNOSIS — M542 Cervicalgia: Secondary | ICD-10-CM | POA: Diagnosis not present

## 2024-01-03 DIAGNOSIS — M9901 Segmental and somatic dysfunction of cervical region: Secondary | ICD-10-CM | POA: Diagnosis not present

## 2024-01-03 DIAGNOSIS — M9902 Segmental and somatic dysfunction of thoracic region: Secondary | ICD-10-CM | POA: Diagnosis not present

## 2024-01-03 DIAGNOSIS — M47812 Spondylosis without myelopathy or radiculopathy, cervical region: Secondary | ICD-10-CM | POA: Diagnosis not present

## 2024-01-06 DIAGNOSIS — M47812 Spondylosis without myelopathy or radiculopathy, cervical region: Secondary | ICD-10-CM | POA: Diagnosis not present

## 2024-01-06 DIAGNOSIS — M542 Cervicalgia: Secondary | ICD-10-CM | POA: Diagnosis not present

## 2024-01-06 DIAGNOSIS — M9902 Segmental and somatic dysfunction of thoracic region: Secondary | ICD-10-CM | POA: Diagnosis not present

## 2024-01-06 DIAGNOSIS — M9901 Segmental and somatic dysfunction of cervical region: Secondary | ICD-10-CM | POA: Diagnosis not present

## 2024-01-25 DIAGNOSIS — M542 Cervicalgia: Secondary | ICD-10-CM | POA: Diagnosis not present

## 2024-01-25 DIAGNOSIS — M9901 Segmental and somatic dysfunction of cervical region: Secondary | ICD-10-CM | POA: Diagnosis not present

## 2024-01-25 DIAGNOSIS — M47812 Spondylosis without myelopathy or radiculopathy, cervical region: Secondary | ICD-10-CM | POA: Diagnosis not present

## 2024-01-25 DIAGNOSIS — M9902 Segmental and somatic dysfunction of thoracic region: Secondary | ICD-10-CM | POA: Diagnosis not present

## 2024-01-27 DIAGNOSIS — M9902 Segmental and somatic dysfunction of thoracic region: Secondary | ICD-10-CM | POA: Diagnosis not present

## 2024-01-27 DIAGNOSIS — M9901 Segmental and somatic dysfunction of cervical region: Secondary | ICD-10-CM | POA: Diagnosis not present

## 2024-01-27 DIAGNOSIS — M542 Cervicalgia: Secondary | ICD-10-CM | POA: Diagnosis not present

## 2024-01-27 DIAGNOSIS — M47812 Spondylosis without myelopathy or radiculopathy, cervical region: Secondary | ICD-10-CM | POA: Diagnosis not present

## 2024-02-21 ENCOUNTER — Other Ambulatory Visit (HOSPITAL_COMMUNITY): Payer: Self-pay

## 2024-02-23 ENCOUNTER — Other Ambulatory Visit (HOSPITAL_COMMUNITY): Payer: Self-pay

## 2024-03-02 DIAGNOSIS — M542 Cervicalgia: Secondary | ICD-10-CM | POA: Diagnosis not present

## 2024-03-02 DIAGNOSIS — M9901 Segmental and somatic dysfunction of cervical region: Secondary | ICD-10-CM | POA: Diagnosis not present

## 2024-03-02 DIAGNOSIS — M9902 Segmental and somatic dysfunction of thoracic region: Secondary | ICD-10-CM | POA: Diagnosis not present

## 2024-03-02 DIAGNOSIS — M47812 Spondylosis without myelopathy or radiculopathy, cervical region: Secondary | ICD-10-CM | POA: Diagnosis not present

## 2024-03-08 ENCOUNTER — Telehealth: Admitting: Adult Health

## 2024-03-08 ENCOUNTER — Encounter: Payer: Self-pay | Admitting: Adult Health

## 2024-03-08 DIAGNOSIS — F909 Attention-deficit hyperactivity disorder, unspecified type: Secondary | ICD-10-CM

## 2024-03-08 DIAGNOSIS — F902 Attention-deficit hyperactivity disorder, combined type: Secondary | ICD-10-CM

## 2024-03-08 MED ORDER — LISDEXAMFETAMINE DIMESYLATE 30 MG PO CAPS: 30.0000 mg | ORAL_CAPSULE | Freq: Every day | ORAL | 0 refills | Status: DC

## 2024-03-08 NOTE — Progress Notes (Signed)
 Jamie Paul [id number] [birth year] 63 y.o.  Virtual Visit via Video Note  I connected with pt @ on 03/08/24 at  8:30 AM EDT by a video enabled telemedicine application and verified that I am speaking with the correct person using two identifiers.   I discussed the limitations of evaluation and management by telemedicine and the availability of in person appointments. The patient expressed understanding and agreed to proceed.  I discussed the assessment and treatment plan with the patient. The patient was provided an opportunity to ask questions and all were answered. The patient agreed with the plan and demonstrated an understanding of the instructions.   The patient was advised to call back or seek an in-person evaluation if the symptoms worsen or if the condition fails to improve as anticipated.  I provided 15 minutes of non-face-to-face time during this encounter.  The patient was located at home.  The provider was located at [facility].   Reagan Camera, NP   Subjective:   Patient ID:  Jamie Paul is a 63 y.o. (DOB 12/16/1960) female.  Chief Complaint: No chief complaint on file.   HPI Jamie Paul presents for follow-up of ADHD.   Describes mood today as ok. Pleasant. Denies tearfulness. Mood symptoms - denies depression, anxiety and irritability. Reports stable interest and motivation.Denies panic attacks. Denies worry and rumination. Reports some over thinking. Reports situational stressors with moving to [place]. Mood is consistent. Stating I feel like I'm doing alright. Feels like the Vyvanse  continues to work well. Taking medications as prescribed.  Energy levels stable. Active, has a regular exercise routine. Enjoys some usual interests and activities. Divorced. Lives with her 60 year old son - 2 cats. Spending time with family. Appetite adequate. Reports weight is stable - 184 pounds - [place].  Reports sleeping well most nights.  Averages 6 to 7 hours. Focus and concentration improved with Vyvanse . Completing tasks. Managing aspects of household. Works full time. Denies SI or HI.  Denies AH or VH. Denies self harm. Denies substance use. Has stopped tobacco use - January 2025.    Previous medication trials:  Xanax    Review of Systems:  Review of Systems  Musculoskeletal:  Negative for gait problem.  Neurological:  Negative for tremors.  Psychiatric/Behavioral:         Please refer to HPI    Medications: I have reviewed the patient's current medications.  Current Outpatient Medications  Medication Sig Dispense Refill   atorvastatin  (LIPITOR) 20 MG tablet Take 20 mg by mouth daily.       Cholecalciferol (VITAMIN D-3) 125 MCG (5000 UT) TABS Take 5,000 Units by mouth daily.     Dulaglutide  (TRULICITY ) 3 MG/0.5ML SOPN Inject 3 mg into the skin once a week. 2 mL 5   Dulaglutide  (TRULICITY ) 4.5 MG/0.5ML SOPN Inject 4.5 mg into the skin once a week. 2 mL 2   FLONASE SENSIMIST 27.5 MCG/SPRAY nasal spray Place 2 sprays into the nose daily as needed for allergies.     liothyronine  (CYTOMEL ) 5 MCG tablet Take 10 mcg by mouth daily.     lisdexamfetamine (VYVANSE ) 30 MG capsule Take 1 capsule (30 mg total) by mouth daily. 30 capsule 0   lisdexamfetamine (VYVANSE ) 30 MG capsule Take 1 capsule (30 mg total) by mouth daily. 30 capsule 0   lisdexamfetamine (VYVANSE ) 30 MG capsule Take 1 capsule (30 mg total) by mouth daily. 30 capsule 0   loratadine (CLARITIN) 10 MG tablet Take 10 mg by  mouth daily.     methocarbamol  (ROBAXIN ) 500 MG tablet Take 1 tablet (500 mg total) by mouth every 8 (eight) hours as needed for muscle spasms. 50 tablet 1   montelukast  (SINGULAIR ) 10 MG tablet Take 10 mg by mouth daily.      Multiple Vitamins-Minerals (MULTIVITAMIN ADULT PO) Take 1 packet by mouth daily. Appetite Support Weight Loss Packet Provided by [employer]     nicotine (NICODERM CQ - DOSED IN MG/24 HOURS) 21 mg/24hr patch Place 21 mg  onto the skin daily. For 2 weeks then will decrease to 14 mg     nicotine polacrilex (NICORETTE) 4 MG gum Take 4 mg by mouth as needed for smoking cessation.     phentermine 15 MG capsule Take 15 mg by mouth every morning. Took 1/2 tab     tirzepatide  (MOUNJARO ) 10 MG/0.5ML Pen Inject 10 mg into the skin once a week. 4 mL 5   tirzepatide  (MOUNJARO ) 10 MG/0.5ML Pen Inject 10 mg into the skin once a week. 6 mL 0   tirzepatide  (MOUNJARO ) 12.5 MG/0.5ML Pen Inject 12.5 mg into the skin once a week. 6 mL 0   tirzepatide  (MOUNJARO ) 12.5 MG/0.5ML Pen Inject 12.5 mg into the skin once a week. 6 mL 3   tirzepatide  (MOUNJARO ) 12.5 MG/0.5ML Pen Inject 12.5 mg into the skin once a week. 6 mL 3   tirzepatide  (MOUNJARO ) 7.5 MG/0.5ML Pen Inject 7.5 mg into the skin once a week. 2 mL 0   UNABLE TO FIND [employer] [facility] Appetite support Takes 2 packets per day     No current facility-administered medications for this visit.    Medication Side Effects: None  Allergies:  Allergies  Allergen Reactions   Meloxicam Other (See Comments)    Past Medical History:  Diagnosis Date   Arthritis    knees, ankles, neck, hands   Dental crowns present    x 2 - right side   High cholesterol    Nail deformity 03/2014   left thumb   Obesity    PONV (postoperative nausea and vomiting)    likes scopolamine  patch   Seasonal allergies     Family History  Problem Relation Age of Onset   Heart attack Father    Arthritis Mother    Diabetes Mother    Hyperlipidemia Brother    Diabetes Brother    Diabetes Sister    Breast cancer Neg Hx     Social History   Socioeconomic History   Marital status: Divorced    Spouse name: Not on file   Number of children: Not on file   Years of education: Not on file   Highest education level: Not on file  Occupational History   Not on file  Tobacco Use   Smoking status: Every Day    Current packs/day: 0.50    Average packs/day: 0.5 packs/day for 30.0 years (15.0 ttl  pk-yrs)    Types: Cigarettes   Smokeless tobacco: Never  Vaping Use   Vaping status: Never Used  Substance and Sexual Activity   Alcohol use: Yes    Comment: rarely   Drug use: No   Sexual activity: Not on file  Other Topics Concern   Not on file  Social History Narrative   Not on file   Social Drivers of Health   Financial Resource Strain: Not on file  Food Insecurity: Not on file  Transportation Needs: Not on file  Physical Activity: Not on file  Stress: Not on  file  Social Connections: Not on file  Intimate Partner Violence: Not on file    Past Medical History, Surgical history, Social history, and Family history were reviewed and updated as appropriate.   Please see review of systems for further details on the patient's review from today.   Objective:   Physical Exam:  There were no vitals taken for this visit.  Physical Exam Constitutional:      General: She is not in acute distress.  Musculoskeletal:        General: No deformity.   Neurological:     Mental Status: She is alert and oriented to person, place, and time.     Coordination: Coordination normal.   Psychiatric:        Attention and Perception: Attention and perception normal. She does not perceive auditory or visual hallucinations.        Mood and Affect: Mood normal. Mood is not anxious or depressed. Affect is not labile, blunt, angry or inappropriate.        Speech: Speech normal.        Behavior: Behavior normal.        Thought Content: Thought content normal. Thought content is not paranoid or delusional. Thought content does not include homicidal or suicidal ideation. Thought content does not include homicidal or suicidal plan.        Cognition and Memory: Cognition and memory normal.        Judgment: Judgment normal.     Comments: Insight intact     Lab Review:     Component Value Date/Time   NA 139 10/21/2018 0337   K 4.2 10/21/2018 0337   CL 110 10/21/2018 0337   CO2 22  10/21/2018 0337   GLUCOSE 119 (H) 10/21/2018 0337   BUN 14 10/21/2018 0337   CREATININE 0.55 10/21/2018 0337   CALCIUM  8.8 (L) 10/21/2018 0337   GFRNONAA >60 10/21/2018 0337   GFRAA >60 10/21/2018 0337       Component Value Date/Time   WBC 12.1 (H) 10/22/2018 0338   RBC 4.55 10/22/2018 0338   HGB 13.0 10/22/2018 0338   HCT 41.4 10/22/2018 0338   PLT 235 10/22/2018 0338   MCV 91.0 10/22/2018 0338   MCH 28.6 10/22/2018 0338   MCHC 31.4 10/22/2018 0338   RDW 14.1 10/22/2018 0338   LYMPHSABS 2.9 04/06/2011 0850   MONOABS 0.8 04/06/2011 0850   EOSABS 0.1 04/06/2011 0850   BASOSABS 0.1 04/06/2011 0850    No results found for: POCLITH, LITHIUM   No results found for: PHENYTOIN, PHENOBARB, VALPROATE, CBMZ   .res Assessment: Plan:    Plan:  PDMP reviewed  Vyvanse  30mg  daily  [facility] ADHD test - 54/58 meets DSM criteria for ADD   Advised to monitor BP between visits while taking stimulant medication.   RTC 3 months  15 minutes spent dedicated to the care of this patient on the date of this encounter to include pre-visit review of records, ordering of medication, post visit documentation, and face-to-face time with the patient discussing ADD. Discussed continuing current medication regimen.  Patient advised to contact office with any questions, adverse effects, or acute worsening in signs and symptoms.  Discussed potential benefits, risks, and side effects of stimulants with patient to include increased heart rate, palpitations, insomnia, increased anxiety, increased irritability, or decreased appetite.  Instructed patient to contact office if experiencing any significant tolerability issues.   There are no diagnoses linked to this encounter.   Please see After Visit  Summary for patient specific instructions.  Future Appointments  Date Time Provider Department Center  03/08/2024  8:30 AM Jamie Singley Nattalie, NP CP-CP None    No orders of the  defined types were placed in this encounter.     -------------------------------

## 2024-03-23 DIAGNOSIS — M9902 Segmental and somatic dysfunction of thoracic region: Secondary | ICD-10-CM | POA: Diagnosis not present

## 2024-03-23 DIAGNOSIS — M542 Cervicalgia: Secondary | ICD-10-CM | POA: Diagnosis not present

## 2024-03-23 DIAGNOSIS — M47812 Spondylosis without myelopathy or radiculopathy, cervical region: Secondary | ICD-10-CM | POA: Diagnosis not present

## 2024-03-23 DIAGNOSIS — M9901 Segmental and somatic dysfunction of cervical region: Secondary | ICD-10-CM | POA: Diagnosis not present

## 2024-03-26 DIAGNOSIS — M542 Cervicalgia: Secondary | ICD-10-CM | POA: Diagnosis not present

## 2024-03-26 DIAGNOSIS — M9901 Segmental and somatic dysfunction of cervical region: Secondary | ICD-10-CM | POA: Diagnosis not present

## 2024-03-26 DIAGNOSIS — M9902 Segmental and somatic dysfunction of thoracic region: Secondary | ICD-10-CM | POA: Diagnosis not present

## 2024-03-26 DIAGNOSIS — M47812 Spondylosis without myelopathy or radiculopathy, cervical region: Secondary | ICD-10-CM | POA: Diagnosis not present

## 2024-04-03 ENCOUNTER — Telehealth: Payer: Self-pay | Admitting: Adult Health

## 2024-04-03 NOTE — Telephone Encounter (Signed)
 Canceled scripts for Vyvanse  at [facility] in [place]. Last filled 6/12, due 7/10.

## 2024-04-03 NOTE — Telephone Encounter (Signed)
 Patient called in for refill on Vyvanse  30mg . States that she has [place] out [place] and pharmacy won't transfer prescription. She needs new prescription sent to [facility] [address] [place] PH: [phone]

## 2024-04-05 ENCOUNTER — Other Ambulatory Visit: Payer: Self-pay

## 2024-04-05 DIAGNOSIS — F902 Attention-deficit hyperactivity disorder, combined type: Secondary | ICD-10-CM

## 2024-04-05 MED ORDER — LISDEXAMFETAMINE DIMESYLATE 30 MG PO CAPS
30.0000 mg | ORAL_CAPSULE | Freq: Every day | ORAL | 0 refills | Status: AC
Start: 2024-05-03 — End: ?

## 2024-04-05 MED ORDER — LISDEXAMFETAMINE DIMESYLATE 30 MG PO CAPS: 30.0000 mg | ORAL_CAPSULE | Freq: Every day | ORAL | 0 refills | Status: AC

## 2024-04-05 NOTE — Telephone Encounter (Signed)
 Pended.

## 2024-04-16 DIAGNOSIS — F330 Major depressive disorder, recurrent, mild: Secondary | ICD-10-CM | POA: Diagnosis not present

## 2024-04-16 DIAGNOSIS — F411 Generalized anxiety disorder: Secondary | ICD-10-CM | POA: Diagnosis not present

## 2024-04-16 DIAGNOSIS — F902 Attention-deficit hyperactivity disorder, combined type: Secondary | ICD-10-CM | POA: Diagnosis not present

## 2024-04-16 DIAGNOSIS — F5101 Primary insomnia: Secondary | ICD-10-CM | POA: Diagnosis not present

## 2024-04-30 DIAGNOSIS — M9901 Segmental and somatic dysfunction of cervical region: Secondary | ICD-10-CM | POA: Diagnosis not present

## 2024-04-30 DIAGNOSIS — G44201 Tension-type headache, unspecified, intractable: Secondary | ICD-10-CM | POA: Diagnosis not present

## 2024-04-30 DIAGNOSIS — M5412 Radiculopathy, cervical region: Secondary | ICD-10-CM | POA: Diagnosis not present

## 2024-04-30 DIAGNOSIS — M50322 Other cervical disc degeneration at C5-C6 level: Secondary | ICD-10-CM | POA: Diagnosis not present

## 2024-05-02 DIAGNOSIS — M50322 Other cervical disc degeneration at C5-C6 level: Secondary | ICD-10-CM | POA: Diagnosis not present

## 2024-05-02 DIAGNOSIS — G44201 Tension-type headache, unspecified, intractable: Secondary | ICD-10-CM | POA: Diagnosis not present

## 2024-05-02 DIAGNOSIS — M5412 Radiculopathy, cervical region: Secondary | ICD-10-CM | POA: Diagnosis not present

## 2024-05-02 DIAGNOSIS — M9901 Segmental and somatic dysfunction of cervical region: Secondary | ICD-10-CM | POA: Diagnosis not present

## 2024-05-08 DIAGNOSIS — M5412 Radiculopathy, cervical region: Secondary | ICD-10-CM | POA: Diagnosis not present

## 2024-05-08 DIAGNOSIS — G44201 Tension-type headache, unspecified, intractable: Secondary | ICD-10-CM | POA: Diagnosis not present

## 2024-05-08 DIAGNOSIS — M9901 Segmental and somatic dysfunction of cervical region: Secondary | ICD-10-CM | POA: Diagnosis not present

## 2024-05-08 DIAGNOSIS — M50322 Other cervical disc degeneration at C5-C6 level: Secondary | ICD-10-CM | POA: Diagnosis not present

## 2024-05-10 DIAGNOSIS — M50322 Other cervical disc degeneration at C5-C6 level: Secondary | ICD-10-CM | POA: Diagnosis not present

## 2024-05-10 DIAGNOSIS — M5412 Radiculopathy, cervical region: Secondary | ICD-10-CM | POA: Diagnosis not present

## 2024-05-10 DIAGNOSIS — G44201 Tension-type headache, unspecified, intractable: Secondary | ICD-10-CM | POA: Diagnosis not present

## 2024-05-10 DIAGNOSIS — M9901 Segmental and somatic dysfunction of cervical region: Secondary | ICD-10-CM | POA: Diagnosis not present

## 2024-05-14 DIAGNOSIS — F411 Generalized anxiety disorder: Secondary | ICD-10-CM | POA: Diagnosis not present

## 2024-05-14 DIAGNOSIS — Z1231 Encounter for screening mammogram for malignant neoplasm of breast: Secondary | ICD-10-CM | POA: Diagnosis not present

## 2024-05-14 DIAGNOSIS — F902 Attention-deficit hyperactivity disorder, combined type: Secondary | ICD-10-CM | POA: Diagnosis not present

## 2024-05-14 DIAGNOSIS — F330 Major depressive disorder, recurrent, mild: Secondary | ICD-10-CM | POA: Diagnosis not present

## 2024-05-14 DIAGNOSIS — Z7689 Persons encountering health services in other specified circumstances: Secondary | ICD-10-CM | POA: Diagnosis not present

## 2024-05-14 DIAGNOSIS — N3000 Acute cystitis without hematuria: Secondary | ICD-10-CM | POA: Diagnosis not present

## 2024-05-14 DIAGNOSIS — Z5181 Encounter for therapeutic drug level monitoring: Secondary | ICD-10-CM | POA: Diagnosis not present

## 2024-05-14 DIAGNOSIS — F5101 Primary insomnia: Secondary | ICD-10-CM | POA: Diagnosis not present

## 2024-05-14 DIAGNOSIS — E785 Hyperlipidemia, unspecified: Secondary | ICD-10-CM | POA: Diagnosis not present

## 2024-05-14 DIAGNOSIS — E119 Type 2 diabetes mellitus without complications: Secondary | ICD-10-CM | POA: Diagnosis not present

## 2024-05-15 DIAGNOSIS — G44201 Tension-type headache, unspecified, intractable: Secondary | ICD-10-CM | POA: Diagnosis not present

## 2024-05-15 DIAGNOSIS — M5412 Radiculopathy, cervical region: Secondary | ICD-10-CM | POA: Diagnosis not present

## 2024-05-15 DIAGNOSIS — M9901 Segmental and somatic dysfunction of cervical region: Secondary | ICD-10-CM | POA: Diagnosis not present

## 2024-05-15 DIAGNOSIS — M50322 Other cervical disc degeneration at C5-C6 level: Secondary | ICD-10-CM | POA: Diagnosis not present

## 2024-05-17 DIAGNOSIS — M9901 Segmental and somatic dysfunction of cervical region: Secondary | ICD-10-CM | POA: Diagnosis not present

## 2024-05-17 DIAGNOSIS — M5412 Radiculopathy, cervical region: Secondary | ICD-10-CM | POA: Diagnosis not present

## 2024-05-17 DIAGNOSIS — G44201 Tension-type headache, unspecified, intractable: Secondary | ICD-10-CM | POA: Diagnosis not present

## 2024-05-17 DIAGNOSIS — M50322 Other cervical disc degeneration at C5-C6 level: Secondary | ICD-10-CM | POA: Diagnosis not present

## 2024-05-29 DIAGNOSIS — M9901 Segmental and somatic dysfunction of cervical region: Secondary | ICD-10-CM | POA: Diagnosis not present

## 2024-05-29 DIAGNOSIS — M5412 Radiculopathy, cervical region: Secondary | ICD-10-CM | POA: Diagnosis not present

## 2024-05-29 DIAGNOSIS — G44201 Tension-type headache, unspecified, intractable: Secondary | ICD-10-CM | POA: Diagnosis not present

## 2024-05-29 DIAGNOSIS — M50322 Other cervical disc degeneration at C5-C6 level: Secondary | ICD-10-CM | POA: Diagnosis not present

## 2024-05-31 DIAGNOSIS — G44201 Tension-type headache, unspecified, intractable: Secondary | ICD-10-CM | POA: Diagnosis not present

## 2024-05-31 DIAGNOSIS — M50322 Other cervical disc degeneration at C5-C6 level: Secondary | ICD-10-CM | POA: Diagnosis not present

## 2024-05-31 DIAGNOSIS — M9901 Segmental and somatic dysfunction of cervical region: Secondary | ICD-10-CM | POA: Diagnosis not present

## 2024-05-31 DIAGNOSIS — M5412 Radiculopathy, cervical region: Secondary | ICD-10-CM | POA: Diagnosis not present

## 2024-06-05 DIAGNOSIS — M5412 Radiculopathy, cervical region: Secondary | ICD-10-CM | POA: Diagnosis not present

## 2024-06-05 DIAGNOSIS — M50322 Other cervical disc degeneration at C5-C6 level: Secondary | ICD-10-CM | POA: Diagnosis not present

## 2024-06-05 DIAGNOSIS — M9901 Segmental and somatic dysfunction of cervical region: Secondary | ICD-10-CM | POA: Diagnosis not present

## 2024-06-05 DIAGNOSIS — G44201 Tension-type headache, unspecified, intractable: Secondary | ICD-10-CM | POA: Diagnosis not present

## 2024-06-12 DIAGNOSIS — M5412 Radiculopathy, cervical region: Secondary | ICD-10-CM | POA: Diagnosis not present

## 2024-06-12 DIAGNOSIS — G44201 Tension-type headache, unspecified, intractable: Secondary | ICD-10-CM | POA: Diagnosis not present

## 2024-06-12 DIAGNOSIS — M50322 Other cervical disc degeneration at C5-C6 level: Secondary | ICD-10-CM | POA: Diagnosis not present

## 2024-06-12 DIAGNOSIS — M9901 Segmental and somatic dysfunction of cervical region: Secondary | ICD-10-CM | POA: Diagnosis not present

## 2024-06-13 DIAGNOSIS — F330 Major depressive disorder, recurrent, mild: Secondary | ICD-10-CM | POA: Diagnosis not present

## 2024-06-13 DIAGNOSIS — F902 Attention-deficit hyperactivity disorder, combined type: Secondary | ICD-10-CM | POA: Diagnosis not present

## 2024-06-13 DIAGNOSIS — F5101 Primary insomnia: Secondary | ICD-10-CM | POA: Diagnosis not present

## 2024-06-13 DIAGNOSIS — F411 Generalized anxiety disorder: Secondary | ICD-10-CM | POA: Diagnosis not present

## 2024-06-14 DIAGNOSIS — G44201 Tension-type headache, unspecified, intractable: Secondary | ICD-10-CM | POA: Diagnosis not present

## 2024-06-14 DIAGNOSIS — M50322 Other cervical disc degeneration at C5-C6 level: Secondary | ICD-10-CM | POA: Diagnosis not present

## 2024-06-14 DIAGNOSIS — M5412 Radiculopathy, cervical region: Secondary | ICD-10-CM | POA: Diagnosis not present

## 2024-06-14 DIAGNOSIS — M9901 Segmental and somatic dysfunction of cervical region: Secondary | ICD-10-CM | POA: Diagnosis not present

## 2024-06-19 DIAGNOSIS — G44201 Tension-type headache, unspecified, intractable: Secondary | ICD-10-CM | POA: Diagnosis not present

## 2024-06-19 DIAGNOSIS — M9901 Segmental and somatic dysfunction of cervical region: Secondary | ICD-10-CM | POA: Diagnosis not present

## 2024-06-19 DIAGNOSIS — M5412 Radiculopathy, cervical region: Secondary | ICD-10-CM | POA: Diagnosis not present

## 2024-06-19 DIAGNOSIS — M50322 Other cervical disc degeneration at C5-C6 level: Secondary | ICD-10-CM | POA: Diagnosis not present

## 2024-06-21 DIAGNOSIS — M5412 Radiculopathy, cervical region: Secondary | ICD-10-CM | POA: Diagnosis not present

## 2024-06-21 DIAGNOSIS — M50322 Other cervical disc degeneration at C5-C6 level: Secondary | ICD-10-CM | POA: Diagnosis not present

## 2024-06-21 DIAGNOSIS — G44201 Tension-type headache, unspecified, intractable: Secondary | ICD-10-CM | POA: Diagnosis not present

## 2024-06-21 DIAGNOSIS — M9901 Segmental and somatic dysfunction of cervical region: Secondary | ICD-10-CM | POA: Diagnosis not present

## 2024-06-26 DIAGNOSIS — M9901 Segmental and somatic dysfunction of cervical region: Secondary | ICD-10-CM | POA: Diagnosis not present

## 2024-06-26 DIAGNOSIS — G44201 Tension-type headache, unspecified, intractable: Secondary | ICD-10-CM | POA: Diagnosis not present

## 2024-06-26 DIAGNOSIS — M50322 Other cervical disc degeneration at C5-C6 level: Secondary | ICD-10-CM | POA: Diagnosis not present

## 2024-06-26 DIAGNOSIS — M5412 Radiculopathy, cervical region: Secondary | ICD-10-CM | POA: Diagnosis not present

## 2024-06-28 DIAGNOSIS — M9901 Segmental and somatic dysfunction of cervical region: Secondary | ICD-10-CM | POA: Diagnosis not present

## 2024-06-28 DIAGNOSIS — M50322 Other cervical disc degeneration at C5-C6 level: Secondary | ICD-10-CM | POA: Diagnosis not present

## 2024-06-28 DIAGNOSIS — M5412 Radiculopathy, cervical region: Secondary | ICD-10-CM | POA: Diagnosis not present

## 2024-06-28 DIAGNOSIS — G44201 Tension-type headache, unspecified, intractable: Secondary | ICD-10-CM | POA: Diagnosis not present

## 2024-07-03 DIAGNOSIS — M5412 Radiculopathy, cervical region: Secondary | ICD-10-CM | POA: Diagnosis not present

## 2024-07-03 DIAGNOSIS — M9901 Segmental and somatic dysfunction of cervical region: Secondary | ICD-10-CM | POA: Diagnosis not present

## 2024-07-03 DIAGNOSIS — M50322 Other cervical disc degeneration at C5-C6 level: Secondary | ICD-10-CM | POA: Diagnosis not present

## 2024-07-03 DIAGNOSIS — G44201 Tension-type headache, unspecified, intractable: Secondary | ICD-10-CM | POA: Diagnosis not present

## 2024-07-05 DIAGNOSIS — M50322 Other cervical disc degeneration at C5-C6 level: Secondary | ICD-10-CM | POA: Diagnosis not present

## 2024-07-05 DIAGNOSIS — M5412 Radiculopathy, cervical region: Secondary | ICD-10-CM | POA: Diagnosis not present

## 2024-07-05 DIAGNOSIS — M9901 Segmental and somatic dysfunction of cervical region: Secondary | ICD-10-CM | POA: Diagnosis not present

## 2024-07-05 DIAGNOSIS — G44201 Tension-type headache, unspecified, intractable: Secondary | ICD-10-CM | POA: Diagnosis not present

## 2024-07-11 DIAGNOSIS — F411 Generalized anxiety disorder: Secondary | ICD-10-CM | POA: Diagnosis not present

## 2024-07-11 DIAGNOSIS — F902 Attention-deficit hyperactivity disorder, combined type: Secondary | ICD-10-CM | POA: Diagnosis not present

## 2024-07-11 DIAGNOSIS — F5101 Primary insomnia: Secondary | ICD-10-CM | POA: Diagnosis not present

## 2024-07-11 DIAGNOSIS — F330 Major depressive disorder, recurrent, mild: Secondary | ICD-10-CM | POA: Diagnosis not present

## 2024-07-12 DIAGNOSIS — M50322 Other cervical disc degeneration at C5-C6 level: Secondary | ICD-10-CM | POA: Diagnosis not present

## 2024-07-12 DIAGNOSIS — M9901 Segmental and somatic dysfunction of cervical region: Secondary | ICD-10-CM | POA: Diagnosis not present

## 2024-07-12 DIAGNOSIS — G44201 Tension-type headache, unspecified, intractable: Secondary | ICD-10-CM | POA: Diagnosis not present

## 2024-07-12 DIAGNOSIS — M5412 Radiculopathy, cervical region: Secondary | ICD-10-CM | POA: Diagnosis not present

## 2024-07-17 DIAGNOSIS — G44201 Tension-type headache, unspecified, intractable: Secondary | ICD-10-CM | POA: Diagnosis not present

## 2024-07-17 DIAGNOSIS — M5412 Radiculopathy, cervical region: Secondary | ICD-10-CM | POA: Diagnosis not present

## 2024-07-17 DIAGNOSIS — M50322 Other cervical disc degeneration at C5-C6 level: Secondary | ICD-10-CM | POA: Diagnosis not present

## 2024-07-17 DIAGNOSIS — M9901 Segmental and somatic dysfunction of cervical region: Secondary | ICD-10-CM | POA: Diagnosis not present

## 2024-07-21 DIAGNOSIS — R300 Dysuria: Secondary | ICD-10-CM | POA: Diagnosis not present

## 2024-07-26 DIAGNOSIS — M50322 Other cervical disc degeneration at C5-C6 level: Secondary | ICD-10-CM | POA: Diagnosis not present

## 2024-07-26 DIAGNOSIS — M5412 Radiculopathy, cervical region: Secondary | ICD-10-CM | POA: Diagnosis not present

## 2024-07-26 DIAGNOSIS — G44201 Tension-type headache, unspecified, intractable: Secondary | ICD-10-CM | POA: Diagnosis not present

## 2024-07-26 DIAGNOSIS — M9901 Segmental and somatic dysfunction of cervical region: Secondary | ICD-10-CM | POA: Diagnosis not present

## 2024-07-27 DIAGNOSIS — Z1231 Encounter for screening mammogram for malignant neoplasm of breast: Secondary | ICD-10-CM | POA: Diagnosis not present

## 2024-07-31 DIAGNOSIS — G44201 Tension-type headache, unspecified, intractable: Secondary | ICD-10-CM | POA: Diagnosis not present

## 2024-07-31 DIAGNOSIS — M5412 Radiculopathy, cervical region: Secondary | ICD-10-CM | POA: Diagnosis not present

## 2024-07-31 DIAGNOSIS — M50322 Other cervical disc degeneration at C5-C6 level: Secondary | ICD-10-CM | POA: Diagnosis not present

## 2024-07-31 DIAGNOSIS — M9901 Segmental and somatic dysfunction of cervical region: Secondary | ICD-10-CM | POA: Diagnosis not present

## 2024-08-01 DIAGNOSIS — N6489 Other specified disorders of breast: Secondary | ICD-10-CM | POA: Diagnosis not present

## 2024-08-01 DIAGNOSIS — R928 Other abnormal and inconclusive findings on diagnostic imaging of breast: Secondary | ICD-10-CM | POA: Diagnosis not present

## 2024-08-07 DIAGNOSIS — M50322 Other cervical disc degeneration at C5-C6 level: Secondary | ICD-10-CM | POA: Diagnosis not present

## 2024-08-07 DIAGNOSIS — M9901 Segmental and somatic dysfunction of cervical region: Secondary | ICD-10-CM | POA: Diagnosis not present

## 2024-08-07 DIAGNOSIS — M5412 Radiculopathy, cervical region: Secondary | ICD-10-CM | POA: Diagnosis not present

## 2024-08-07 DIAGNOSIS — G44201 Tension-type headache, unspecified, intractable: Secondary | ICD-10-CM | POA: Diagnosis not present

## 2024-08-08 DIAGNOSIS — F902 Attention-deficit hyperactivity disorder, combined type: Secondary | ICD-10-CM | POA: Diagnosis not present

## 2024-08-08 DIAGNOSIS — F5101 Primary insomnia: Secondary | ICD-10-CM | POA: Diagnosis not present

## 2024-08-08 DIAGNOSIS — F330 Major depressive disorder, recurrent, mild: Secondary | ICD-10-CM | POA: Diagnosis not present

## 2024-08-08 DIAGNOSIS — F411 Generalized anxiety disorder: Secondary | ICD-10-CM | POA: Diagnosis not present

## 2024-08-14 DIAGNOSIS — M9901 Segmental and somatic dysfunction of cervical region: Secondary | ICD-10-CM | POA: Diagnosis not present

## 2024-08-14 DIAGNOSIS — M5412 Radiculopathy, cervical region: Secondary | ICD-10-CM | POA: Diagnosis not present

## 2024-08-14 DIAGNOSIS — M50322 Other cervical disc degeneration at C5-C6 level: Secondary | ICD-10-CM | POA: Diagnosis not present

## 2024-08-14 DIAGNOSIS — G44201 Tension-type headache, unspecified, intractable: Secondary | ICD-10-CM | POA: Diagnosis not present

## 2024-08-22 DIAGNOSIS — G44201 Tension-type headache, unspecified, intractable: Secondary | ICD-10-CM | POA: Diagnosis not present

## 2024-08-22 DIAGNOSIS — M5412 Radiculopathy, cervical region: Secondary | ICD-10-CM | POA: Diagnosis not present

## 2024-08-22 DIAGNOSIS — M9901 Segmental and somatic dysfunction of cervical region: Secondary | ICD-10-CM | POA: Diagnosis not present

## 2024-08-22 DIAGNOSIS — M50322 Other cervical disc degeneration at C5-C6 level: Secondary | ICD-10-CM | POA: Diagnosis not present

## 2024-09-04 DIAGNOSIS — G44201 Tension-type headache, unspecified, intractable: Secondary | ICD-10-CM | POA: Diagnosis not present

## 2024-09-04 DIAGNOSIS — M50322 Other cervical disc degeneration at C5-C6 level: Secondary | ICD-10-CM | POA: Diagnosis not present

## 2024-09-04 DIAGNOSIS — M5412 Radiculopathy, cervical region: Secondary | ICD-10-CM | POA: Diagnosis not present

## 2024-09-04 DIAGNOSIS — M9901 Segmental and somatic dysfunction of cervical region: Secondary | ICD-10-CM | POA: Diagnosis not present

## 2024-09-05 DIAGNOSIS — F5101 Primary insomnia: Secondary | ICD-10-CM | POA: Diagnosis not present

## 2024-09-05 DIAGNOSIS — F330 Major depressive disorder, recurrent, mild: Secondary | ICD-10-CM | POA: Diagnosis not present

## 2024-09-05 DIAGNOSIS — F411 Generalized anxiety disorder: Secondary | ICD-10-CM | POA: Diagnosis not present

## 2024-09-05 DIAGNOSIS — F902 Attention-deficit hyperactivity disorder, combined type: Secondary | ICD-10-CM | POA: Diagnosis not present

## 2024-09-13 DIAGNOSIS — Z800 Family history of malignant neoplasm of digestive organs: Secondary | ICD-10-CM | POA: Diagnosis not present

## 2024-09-13 DIAGNOSIS — M50322 Other cervical disc degeneration at C5-C6 level: Secondary | ICD-10-CM | POA: Diagnosis not present

## 2024-09-13 DIAGNOSIS — R928 Other abnormal and inconclusive findings on diagnostic imaging of breast: Secondary | ICD-10-CM | POA: Diagnosis not present

## 2024-09-13 DIAGNOSIS — M5412 Radiculopathy, cervical region: Secondary | ICD-10-CM | POA: Diagnosis not present

## 2024-09-13 DIAGNOSIS — Z808 Family history of malignant neoplasm of other organs or systems: Secondary | ICD-10-CM | POA: Diagnosis not present

## 2024-09-13 DIAGNOSIS — M9901 Segmental and somatic dysfunction of cervical region: Secondary | ICD-10-CM | POA: Diagnosis not present

## 2024-09-13 DIAGNOSIS — G44201 Tension-type headache, unspecified, intractable: Secondary | ICD-10-CM | POA: Diagnosis not present

## 2024-09-25 DIAGNOSIS — M5412 Radiculopathy, cervical region: Secondary | ICD-10-CM | POA: Diagnosis not present

## 2024-09-25 DIAGNOSIS — M50322 Other cervical disc degeneration at C5-C6 level: Secondary | ICD-10-CM | POA: Diagnosis not present

## 2024-09-25 DIAGNOSIS — M9901 Segmental and somatic dysfunction of cervical region: Secondary | ICD-10-CM | POA: Diagnosis not present

## 2024-09-25 DIAGNOSIS — G44201 Tension-type headache, unspecified, intractable: Secondary | ICD-10-CM | POA: Diagnosis not present
